# Patient Record
Sex: Female | Born: 1995 | Race: White | Hispanic: No | Marital: Single | Smoking: Never smoker
Health system: Southern US, Community
[De-identification: ages and names within clinical notes are randomized; demographics above are authoritative.]

## PROBLEM LIST (undated history)

## (undated) DIAGNOSIS — R109 Unspecified abdominal pain: Secondary | ICD-10-CM

## (undated) DIAGNOSIS — D649 Anemia, unspecified: Secondary | ICD-10-CM

## (undated) DIAGNOSIS — J45909 Unspecified asthma, uncomplicated: Secondary | ICD-10-CM

## (undated) DIAGNOSIS — F32A Depression, unspecified: Secondary | ICD-10-CM

## (undated) DIAGNOSIS — F329 Major depressive disorder, single episode, unspecified: Secondary | ICD-10-CM

## (undated) DIAGNOSIS — J189 Pneumonia, unspecified organism: Secondary | ICD-10-CM

## (undated) HISTORY — DX: Unspecified abdominal pain: R10.9

## (undated) HISTORY — PX: POLYDACTYLY RECONSTRUCTION: SHX439

## (undated) HISTORY — DX: Pneumonia, unspecified organism: J18.9

## (undated) HISTORY — DX: Depression, unspecified: F32.A

---
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
## 2018-06-10 ENCOUNTER — Emergency Department
Admission: EM | Admit: 2018-06-10 | Discharge: 2018-06-10 | Disposition: A | Discharge status: Home / Self Care | Payer: BLUE CROSS/BLUE SHIELD | Attending: Emergency Medicine | Admitting: Emergency Medicine

## 2018-06-10 ENCOUNTER — Other Ambulatory Visit: Payer: Self-pay

## 2018-06-10 ENCOUNTER — Emergency Department: Payer: BLUE CROSS/BLUE SHIELD

## 2018-06-10 DIAGNOSIS — J45909 Unspecified asthma, uncomplicated: Secondary | ICD-10-CM | POA: Diagnosis not present

## 2018-06-10 DIAGNOSIS — R1031 Right lower quadrant pain: Secondary | ICD-10-CM | POA: Insufficient documentation

## 2018-06-10 DIAGNOSIS — R102 Pelvic and perineal pain: Secondary | ICD-10-CM

## 2018-06-10 DIAGNOSIS — R109 Unspecified abdominal pain: Secondary | ICD-10-CM

## 2018-06-10 LAB — CBC
HEMATOCRIT: 37.9 % (ref 36.0–46.0)
HEMOGLOBIN: 11.9 g/dL — AB (ref 12.0–15.0)
MCH: 26.6 pg (ref 26.0–34.0)
MCHC: 31.4 g/dL (ref 30.0–36.0)
MCV: 84.6 fL (ref 80.0–100.0)
NRBC: 0 % (ref 0.0–0.2)
Platelets: 353 10*3/uL (ref 150–400)
RBC: 4.48 MIL/uL (ref 3.87–5.11)
RDW: 14.4 % (ref 11.5–15.5)
WBC: 9.8 10*3/uL (ref 4.0–10.5)

## 2018-06-10 LAB — COMPREHENSIVE METABOLIC PANEL
ALT: 27 U/L (ref 0–44)
AST: 23 U/L (ref 15–41)
Albumin: 4 g/dL (ref 3.5–5.0)
Alkaline Phosphatase: 68 U/L (ref 38–126)
Anion gap: 5 (ref 5–15)
BUN: 15 mg/dL (ref 6–20)
CHLORIDE: 105 mmol/L (ref 98–111)
CO2: 29 mmol/L (ref 22–32)
Calcium: 9.1 mg/dL (ref 8.9–10.3)
Creatinine, Ser: 0.59 mg/dL (ref 0.44–1.00)
Glucose, Bld: 107 mg/dL — ABNORMAL HIGH (ref 70–99)
POTASSIUM: 3.8 mmol/L (ref 3.5–5.1)
SODIUM: 139 mmol/L (ref 135–145)
Total Bilirubin: 0.5 mg/dL (ref 0.3–1.2)
Total Protein: 8 g/dL (ref 6.5–8.1)

## 2018-06-10 LAB — URINALYSIS, COMPLETE (UACMP) WITH MICROSCOPIC
BILIRUBIN URINE: NEGATIVE
Glucose, UA: NEGATIVE mg/dL
Ketones, ur: NEGATIVE mg/dL
NITRITE: NEGATIVE
PROTEIN: NEGATIVE mg/dL
SPECIFIC GRAVITY, URINE: 1.02 (ref 1.005–1.030)
pH: 6 (ref 5.0–8.0)

## 2018-06-10 LAB — POCT PREGNANCY, URINE: PREG TEST UR: NEGATIVE

## 2018-06-10 MED ORDER — DICYCLOMINE HCL 20 MG PO TABS: 20.0000 mg | ORAL_TABLET | Freq: Three times a day (TID) | ORAL | 0 refills | Status: DC | PRN

## 2018-06-10 MED ORDER — KETOROLAC TROMETHAMINE 30 MG/ML IJ SOLN
30.0000 mg | Freq: Once | INTRAMUSCULAR | Status: DC
  Filled 2018-06-10: qty 1

## 2018-06-10 MED ORDER — KETOROLAC TROMETHAMINE 30 MG/ML IJ SOLN
30.0000 mg | Freq: Once | INTRAMUSCULAR | Status: AC
  Administered 2018-06-10: 30 mg via INTRAMUSCULAR

## 2018-06-10 NOTE — ED Notes (Signed)
Pt verbalized understanding of discharge instructions. NAD at this time. 

## 2018-06-10 NOTE — ED Triage Notes (Signed)
Pt c/o right lower abd/flank pain x1 week - denies N/V/D - denies difficulty with urination - she went to urgent care several days ago and they told her that she had a UTI and treated her but she reports that she is no better

## 2018-06-10 NOTE — ED Provider Notes (Signed)
[facility] Emergency Department Provider Note  ____________________________________________   I have reviewed the triage vital signs and the nursing notes.   HISTORY  Chief Complaint Abdominal pain  History limited by: Not Limited   HPI Megan Barnes is a 23 y.o. female who presents to the emergency department today because of concerns for right sided abdominal pain.  Is located in the right lower abdomen.  Started roughly 1 week ago.  She states it has been fairly constant since then.  It is worse with movement.  She has been unable to lie on her side.  Somewhat relieved by lying flat on her back.  She has not noticed any change to her urine or stooling.  Patient has not had any nausea or vomiting.  No fevers.  No abnormal vaginal discharge.  Patient is currently menstruating.  Denies similar symptoms in the past.  Does have a history of cholecystectomy.  Denies any trauma to her side.  Did go to urgent care when the pain started and was treated for urinary tract infection.    Per medical record review patient has a history of cholecystectomy  Past Medical History:  Diagnosis Date  . Anemia   . Asthma     Patient Active Problem List   Diagnosis Date Noted  . Cholelithiasis with acute on chronic cholecystitis 03/05/2015  . Acute cholecystitis   . Abdominal pain, left upper quadrant 03/11/2013  . Abdominal pain, epigastric 03/11/2013  . Anemia, iron deficiency 03/11/2013    Past Surgical History:  Procedure Laterality Date  . CHOLECYSTECTOMY N/A 03/05/2015   Procedure: LAPAROSCOPIC CHOLECYSTECTOMY ;  Surgeon: Lattie Haw, MD;  Location: [facility] ORS;  Service: General;  Laterality: N/A;    Prior to Admission medications   Medication Sig Start Date End Date Taking? Authorizing Provider  fluticasone (FLONASE) 50 MCG/ACT nasal spray Place 2 sprays into both nostrils daily. 09/08/15 09/07/16  Tommi Rumps, PA-C  GILDESS FE 1/20 1-20 MG-MCG tablet  Take 1 tablet by mouth daily. 02/02/15   [provider]  PROAIR HFA 108 (90 BASE) MCG/ACT inhaler Inhale 2 puffs into the lungs every 4 (four) hours. 02/02/15   [provider]  QVAR 80 MCG/ACT inhaler Inhale 1 puff into the lungs 2 (two) times daily. 02/02/15   [provider]    Allergies Patient has no known allergies.  Family History  Problem Relation Age of Onset  . Cancer Maternal Aunt   . Diabetes Maternal Uncle   . Cancer Maternal Uncle   . Cancer Maternal Grandmother   . Diabetes Maternal Grandfather     Social History Social History   Tobacco Use  . Smoking status: Never Smoker  . Smokeless tobacco: Never Used  Substance Use Topics  . Alcohol use: No  . Drug use: No    Review of Systems Constitutional: No fever/chills Eyes: No visual changes. ENT: No sore throat. Cardiovascular: Denies chest pain. Respiratory: Denies shortness of breath. Gastrointestinal: Positive for right sided abdominal pain. Genitourinary: Negative for dysuria. Musculoskeletal: Negative for back pain. Skin: Negative for rash. Neurological: Negative for headaches, focal weakness or numbness.  ____________________________________________   PHYSICAL EXAM:  VITAL SIGNS: ED Triage Vitals [06/10/18 1022]  Enc Vitals Group     BP 135/78     Pulse Rate (!) 115     Resp 16     Temp 97.9 F (36.6 C)     Temp Source Oral     SpO2 97 %  Weight 210 lb (95.3 kg)     Height 5\' 8"  (1.727 m)     Head Circumference      Peak Flow      Pain Score 8   Constitutional: Alert and oriented.  Eyes: Conjunctivae are normal.  ENT      Head: Normocephalic and atraumatic.      Nose: No congestion/rhinnorhea.      Mouth/Throat: Mucous membranes are moist.      Neck: No stridor. Hematological/Lymphatic/Immunilogical: No cervical lymphadenopathy. Cardiovascular: Normal rate, regular rhythm.  No murmurs, rubs, or gallops.  Respiratory: Normal respiratory effort without  tachypnea nor retractions. Breath sounds are clear and equal bilaterally. No wheezes/rales/rhonchi. Gastrointestinal: Soft and tender to palpation in the right lower quadrant. Genitourinary: Deferred Musculoskeletal: Normal range of motion in all extremities. No lower extremity edema. Neurologic:  Normal speech and language. No gross focal neurologic deficits are appreciated.  Skin:  Skin is warm, dry and intact. No rash noted. Psychiatric: Mood and affect are normal. Speech and behavior are normal. Patient exhibits appropriate insight and judgment.  ____________________________________________    LABS (pertinent positives/negatives)  Upreg negative CMP wnl except glu 107 CBC wbc 9.8, hgb 11.9, plt 353 UA clear, moderate hgb dipstick, trace leukocytes, 6-10 wbc, rare bacteria  ____________________________________________   EKG  None  ____________________________________________    RADIOLOGY  US pelvis Within normal limits.   ____________________________________________   PROCEDURES  Procedures  ____________________________________________   INITIAL IMPRESSION / ASSESSMENT AND PLAN / ED COURSE  Pertinent labs & imaging results that were available during my care of the patient were reviewed by me and considered in my medical decision making (see chart for details).   Patient presented to the emergency department today because of concern for right sided abdominal pain. Differential would be broad including ovarian pathology, UTI, kidney stone, abdominal muscle, appendicitis, amongst other etiologies. Patient without any elevated WBC in her serum, no fever. At this point doubt appendicitis. [place] was performed which did not show any concerning findings. Patient did feel better after the medication. Did discuss ct scan with patient at this time will plan on deferring. Did discuss return precautions.   ____________________________________________   FINAL CLINICAL  IMPRESSION(S) / ED DIAGNOSES  Final diagnoses:  Right adnexal tenderness  Right sided abdominal pain     Note: This dictation was prepared with Dragon dictation. Any transcriptional errors that result from this process are unintentional     Phineas Semen, MD 06/10/18 [phone]

## 2018-06-10 NOTE — Discharge Instructions (Addendum)
Please seek medical attention for any high fevers, chest pain, shortness of breath, change in behavior, persistent vomiting, bloody stool or any other new or concerning symptoms.  

## 2018-06-12 LAB — URINE CULTURE: Culture: 30000 — AB

## 2018-11-06 ENCOUNTER — Ambulatory Visit: Payer: BLUE CROSS/BLUE SHIELD | Admitting: Family Medicine

## 2019-02-15 ENCOUNTER — Other Ambulatory Visit: Payer: Self-pay

## 2019-02-15 ENCOUNTER — Emergency Department
Admission: EM | Admit: 2019-02-15 | Discharge: 2019-02-15 | Disposition: A | Discharge status: Home / Self Care | Payer: BLUE CROSS/BLUE SHIELD | Attending: Emergency Medicine | Admitting: Emergency Medicine

## 2019-02-15 ENCOUNTER — Emergency Department: Payer: BLUE CROSS/BLUE SHIELD

## 2019-02-15 DIAGNOSIS — Z79899 Other long term (current) drug therapy: Secondary | ICD-10-CM | POA: Diagnosis not present

## 2019-02-15 DIAGNOSIS — S199XXA Unspecified injury of neck, initial encounter: Secondary | ICD-10-CM | POA: Diagnosis present

## 2019-02-15 DIAGNOSIS — Y9389 Activity, other specified: Secondary | ICD-10-CM | POA: Diagnosis not present

## 2019-02-15 DIAGNOSIS — S161XXA Strain of muscle, fascia and tendon at neck level, initial encounter: Secondary | ICD-10-CM

## 2019-02-15 DIAGNOSIS — Y92410 Unspecified street and highway as the place of occurrence of the external cause: Secondary | ICD-10-CM | POA: Insufficient documentation

## 2019-02-15 DIAGNOSIS — M25512 Pain in left shoulder: Secondary | ICD-10-CM | POA: Insufficient documentation

## 2019-02-15 DIAGNOSIS — Y999 Unspecified external cause status: Secondary | ICD-10-CM | POA: Insufficient documentation

## 2019-02-15 MED ORDER — TRAMADOL HCL 50 MG PO TABS: 50.0000 mg | ORAL_TABLET | Freq: Four times a day (QID) | ORAL | 0 refills | Status: AC | PRN

## 2019-02-15 MED ORDER — TRAMADOL HCL 50 MG PO TABS
50.0000 mg | ORAL_TABLET | Freq: Once | ORAL | Status: AC
  Administered 2019-02-15: 13:00:00 50 mg via ORAL
  Filled 2019-02-15: qty 1

## 2019-02-15 MED ORDER — IBUPROFEN 600 MG PO TABS: 600.0000 mg | ORAL_TABLET | Freq: Three times a day (TID) | ORAL | 0 refills | Status: DC | PRN

## 2019-02-15 MED ORDER — CYCLOBENZAPRINE HCL 10 MG PO TABS: 10.0000 mg | ORAL_TABLET | Freq: Three times a day (TID) | ORAL | 0 refills | Status: DC | PRN

## 2019-02-15 MED ORDER — IBUPROFEN 600 MG PO TABS
600.0000 mg | ORAL_TABLET | Freq: Once | ORAL | Status: AC
  Administered 2019-02-15: 13:00:00 600 mg via ORAL
  Filled 2019-02-15: qty 1

## 2019-02-15 MED ORDER — CYCLOBENZAPRINE HCL 10 MG PO TABS
10.0000 mg | ORAL_TABLET | Freq: Once | ORAL | Status: AC
  Administered 2019-02-15: 13:00:00 10 mg via ORAL
  Filled 2019-02-15: qty 1
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
## 2019-02-15 NOTE — Discharge Instructions (Signed)
Follow discharge care instructions and wear arm sling for 2 to 3 days as needed.  Take medication as directed but be advised it may cause drowsiness.
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
## 2019-02-21 ENCOUNTER — Telehealth: Payer: Self-pay | Admitting: Gerontology

## 2019-02-21 NOTE — Telephone Encounter (Signed)
Patient was not interested in attending [facility]

## 2019-03-30 ENCOUNTER — Encounter: Payer: Self-pay | Admitting: Emergency Medicine

## 2019-03-30 ENCOUNTER — Emergency Department
Admission: EM | Admit: 2019-03-30 | Discharge: 2019-03-30 | Disposition: A | Discharge status: Home / Self Care | Payer: BLUE CROSS/BLUE SHIELD | Attending: Emergency Medicine | Admitting: Emergency Medicine

## 2019-03-30 ENCOUNTER — Other Ambulatory Visit: Payer: Self-pay

## 2019-03-30 DIAGNOSIS — J45909 Unspecified asthma, uncomplicated: Secondary | ICD-10-CM | POA: Diagnosis not present

## 2019-03-30 DIAGNOSIS — J4521 Mild intermittent asthma with (acute) exacerbation: Secondary | ICD-10-CM | POA: Insufficient documentation

## 2019-03-30 DIAGNOSIS — R0602 Shortness of breath: Secondary | ICD-10-CM | POA: Diagnosis present

## 2019-03-30 MED ORDER — IPRATROPIUM-ALBUTEROL 0.5-2.5 (3) MG/3ML IN SOLN
3.0000 mL | Freq: Once | RESPIRATORY_TRACT | Status: AC
  Administered 2019-03-30: 22:00:00 3 mL via RESPIRATORY_TRACT
  Filled 2019-03-30: qty 3

## 2019-03-30 MED ORDER — ALBUTEROL SULFATE HFA 108 (90 BASE) MCG/ACT IN AERS: 2.0000 | INHALATION_SPRAY | Freq: Four times a day (QID) | RESPIRATORY_TRACT | 0 refills | Status: DC | PRN

## 2019-03-30 MED ORDER — ALBUTEROL SULFATE HFA 108 (90 BASE) MCG/ACT IN AERS
2.0000 | INHALATION_SPRAY | Freq: Once | RESPIRATORY_TRACT | Status: AC
  Administered 2019-03-30: 2 via RESPIRATORY_TRACT
  Filled 2019-03-30: qty 6.7

## 2019-03-30 MED ORDER — PREDNISONE 20 MG PO TABS
60.0000 mg | ORAL_TABLET | Freq: Once | ORAL | Status: AC
  Administered 2019-03-30: 22:00:00 60 mg via ORAL
  Filled 2019-03-30: qty 3

## 2019-03-30 NOTE — ED Notes (Signed)
Pt states she feels as though she is breathing much better since arrival after meds. Pt appears calmer and breathing easier.

## 2019-03-30 NOTE — ED Triage Notes (Signed)
Pt arrived via POV with reports of  3 days of shortness of breath, pt states she has hx of asthma and has run out of her inhaler and does not have a PCP to follow up with for refills.

## 2019-03-30 NOTE — ED Provider Notes (Signed)
[facility] Emergency Department Provider Note  ____________________________________________  Time seen: Approximately 10:13 PM  I have reviewed the triage vital signs and the nursing notes.   HISTORY  Chief Complaint Asthma    HPI Megan Barnes is a 23 y.o. female that presents to the emergency department for evaluation of shortness of breath for 3 days, consistent with her asthma.  Patient states that she does not have a primary care doctor so she has not been able to get a refill on her inhaler.  She thought she had enough inhaler to last her through the season but does not.  She ran out a couple of days ago.  Her asthma flares up whenever the seasons change.  She has had no recent illness.  No fever, nasal ingestion, cough, chest pain.   Past Medical History:  Diagnosis Date  . Abdominal pain   . Anemia   . Asthma   . Depression   . Pneumonia     Patient Active Problem List   Diagnosis Date Noted  . Cholelithiasis with acute on chronic cholecystitis 03/05/2015  . Acute cholecystitis   . Abdominal pain, left upper quadrant 03/11/2013  . Abdominal pain, epigastric 03/11/2013  . Anemia, iron deficiency 03/11/2013    Past Surgical History:  Procedure Laterality Date  . CHOLECYSTECTOMY N/A 03/05/2015   Procedure: LAPAROSCOPIC CHOLECYSTECTOMY ;  Surgeon: Florene Glen, MD;  Location: [facility] ORS;  Service: General;  Laterality: N/A;  . POLYDACTYLY RECONSTRUCTION      Prior to Admission medications   Medication Sig Start Date End Date Taking? Authorizing Provider  albuterol (VENTOLIN HFA) 108 (90 Base) MCG/ACT inhaler Inhale 2 puffs into the lungs every 6 (six) hours as needed for wheezing or shortness of breath. 03/30/19   Laban Emperor, PA-C  cyclobenzaprine (FLEXERIL) 10 MG tablet Take 1 tablet (10 mg total) by mouth 3 (three) times daily as needed. 02/15/19   Sable Feil, PA-C  dicyclomine (BENTYL) 20 MG tablet Take 1 tablet (20 mg total)  by mouth 3 (three) times daily as needed (abdominal pain). 06/10/18   Nance Pear, MD  fluticasone [facility]) 50 MCG/ACT nasal spray Place 2 sprays into both nostrils daily. 09/08/15 09/07/16  Johnn Hai, PA-C  GILDESS FE 1/20 1-20 MG-MCG tablet Take 1 tablet by mouth daily. 02/02/15   [provider]  ibuprofen (ADVIL) 600 MG tablet Take 1 tablet (600 mg total) by mouth every 8 (eight) hours as needed. 02/15/19   Sable Feil, PA-C  QVAR 80 MCG/ACT inhaler Inhale 1 puff into the lungs 2 (two) times daily. 02/02/15   [provider]    Allergies Penicillins  Family History  Problem Relation Age of Onset  . Cancer Maternal Aunt   . Diabetes Maternal Uncle   . Cancer Maternal Uncle   . Cancer Maternal Grandmother   . Diabetes Maternal Grandfather     Social History Social History   Tobacco Use  . Smoking status: Never Smoker  . Smokeless tobacco: Never Used  Substance Use Topics  . Alcohol use: No  . Drug use: No     Review of Systems  Constitutional: No fever/chills ENT: No upper respiratory complaints. Cardiovascular: No chest pain. Respiratory: No cough.  Positive for shortness of breath. Gastrointestinal: No abdominal pain.  No nausea, no vomiting.  Musculoskeletal: Negative for musculoskeletal pain. Skin: Negative for rash, abrasions, lacerations, ecchymosis. Neurological: Negative for headaches, numbness or tingling   ____________________________________________   PHYSICAL EXAM:  VITAL SIGNS: ED Triage Vitals [03/30/19 2014]  Enc Vitals Group     BP (!) 163/85     Pulse Rate 91     Resp 18     Temp 97.9 F (36.6 C)     Temp Source Oral     SpO2 98 %     Weight 193 lb (87.5 kg)     Height 5\' 8"  (1.727 m)     Head Circumference      Peak Flow      Pain Score 8     Pain Loc      Pain Edu?      Excl. in [place]?      Constitutional: Alert and oriented. Well appearing and in no acute distress. Eyes: Conjunctivae are normal. PERRL.  EOMI. Head: Atraumatic. ENT:      Ears:      Nose: No congestion/rhinnorhea.      Mouth/Throat: Mucous membranes are moist.  Neck: No stridor.  Cardiovascular: Normal rate, regular rhythm.  Good peripheral circulation. Respiratory: Normal respiratory effort without tachypnea or retractions.  Scattered inspiratory next very wheezes. Good air entry to the bases with no decreased or absent breath sounds. Musculoskeletal: Full range of motion to all extremities. No gross deformities appreciated. Neurologic:  Normal speech and language. No gross focal neurologic deficits are appreciated.  Skin:  Skin is warm, dry and intact. No rash noted. Psychiatric: Mood and affect are normal. Speech and behavior are normal. Patient exhibits appropriate insight and judgement.   ____________________________________________   LABS (all labs ordered are listed, but only abnormal results are displayed)  Labs Reviewed - No data to display ____________________________________________  EKG   ____________________________________________  RADIOLOGY  No results found.  ____________________________________________    PROCEDURES  Procedure(s) performed:    Procedures    Medications  ipratropium-albuterol (DUONEB) 0.5-2.5 (3) MG/3ML nebulizer solution 3 mL (3 mLs Nebulization Given 03/30/19 2141)  ipratropium-albuterol (DUONEB) 0.5-2.5 (3) MG/3ML nebulizer solution 3 mL (3 mLs Nebulization Given 03/30/19 2218)  predniSONE (DELTASONE) tablet 60 mg (60 mg Oral Given 03/30/19 2216)  albuterol (VENTOLIN HFA) 108 (90 Base) MCG/ACT inhaler 2 puff (2 puffs Inhalation Given 03/30/19 2300)     ____________________________________________   INITIAL IMPRESSION / ASSESSMENT AND PLAN / ED COURSE  Pertinent labs & imaging results that were available during my care of the patient were reviewed by me and considered in my medical decision making (see chart for details).  Review of the [place] CSRS was  performed in accordance of the [facility] prior to dispensing any controlled drugs.    Patient's diagnosis is consistent with asthma exacerbation.  Vital signs and exam are reassuring.  Symptoms improved after prednisone and DuoNeb treatments.  She feels much better.  She was also given an albuterol inhaler since the pharmacies are closed.  Patient will be discharged home with prescriptions for albuterol inhaler. Patient is to follow up with primary care as directed.  Referral was given.  Patient is given ED precautions to return to the ED for any worsening or new symptoms.  Dixon BoosChantal R Mccleave was evaluated in Emergency Department on 03/30/2019 for the symptoms described in the history of present illness. She was evaluated in the context of the global COVID-19 pandemic, which necessitated consideration that the patient might be at risk for infection with the SARS-CoV-2 virus that causes COVID-19. Institutional protocols and algorithms that pertain to the evaluation of patients at risk for COVID-19 are in a state of rapid change based on  information released by regulatory bodies including the CDC and federal and state organizations. These policies and algorithms were followed during the patient's care in the ED.   ____________________________________________  FINAL CLINICAL IMPRESSION(S) / ED DIAGNOSES  Final diagnoses:  Mild intermittent asthma with exacerbation      NEW MEDICATIONS STARTED DURING THIS VISIT:  ED Discharge Orders         Ordered    albuterol (VENTOLIN HFA) 108 (90 Base) MCG/ACT inhaler  Every 6 hours PRN     03/30/19 2248              This chart was dictated using voice recognition software/Dragon. Despite best efforts to proofread, errors can occur which can change the meaning. Any change was purely unintentional.    Enid Derry, PA-C 03/30/19 2307    Minna Antis, MD 03/31/19 [phone]

## 2019-03-30 NOTE — ED Notes (Signed)
Pt states she has asthma but no inhalers and no primary doctor. Pt appears to be stable, not breathing rapidly but has bilateral inspiratory and expiratory wheezes throughout lungs.

## 2019-04-10 ENCOUNTER — Other Ambulatory Visit: Payer: Self-pay

## 2019-04-10 DIAGNOSIS — Z20822 Contact with and (suspected) exposure to covid-19: Secondary | ICD-10-CM

## 2019-04-12 LAB — NOVEL CORONAVIRUS, NAA: SARS-CoV-2, NAA: NOT DETECTED

## 2019-09-13 ENCOUNTER — Emergency Department
Admission: EM | Admit: 2019-09-13 | Discharge: 2019-09-13 | Disposition: A | Discharge status: Home / Self Care | Payer: BLUE CROSS/BLUE SHIELD | Attending: Emergency Medicine | Admitting: Emergency Medicine

## 2019-09-13 ENCOUNTER — Encounter: Payer: Self-pay | Admitting: Emergency Medicine

## 2019-09-13 ENCOUNTER — Other Ambulatory Visit: Payer: Self-pay

## 2019-09-13 ENCOUNTER — Emergency Department: Payer: BLUE CROSS/BLUE SHIELD

## 2019-09-13 DIAGNOSIS — R519 Headache, unspecified: Secondary | ICD-10-CM | POA: Insufficient documentation

## 2019-09-13 DIAGNOSIS — Z79899 Other long term (current) drug therapy: Secondary | ICD-10-CM | POA: Insufficient documentation

## 2019-09-13 DIAGNOSIS — J45909 Unspecified asthma, uncomplicated: Secondary | ICD-10-CM | POA: Insufficient documentation

## 2019-09-13 MED ORDER — SULFAMETHOXAZOLE-TRIMETHOPRIM 800-160 MG PO TABS: 1.0000 | ORAL_TABLET | Freq: Two times a day (BID) | ORAL | 0 refills | Status: DC

## 2019-09-13 MED ORDER — KETOROLAC TROMETHAMINE 30 MG/ML IJ SOLN
30.0000 mg | Freq: Once | INTRAMUSCULAR | Status: AC
  Administered 2019-09-13: 30 mg via INTRAVENOUS
  Filled 2019-09-13: qty 1

## 2019-09-13 MED ORDER — DIPHENHYDRAMINE HCL 50 MG/ML IJ SOLN
25.0000 mg | Freq: Once | INTRAMUSCULAR | Status: AC
  Administered 2019-09-13: 25 mg via INTRAVENOUS
  Filled 2019-09-13: qty 1

## 2019-09-13 MED ORDER — ONDANSETRON HCL 4 MG/2ML IJ SOLN
4.0000 mg | Freq: Once | INTRAMUSCULAR | Status: AC
  Administered 2019-09-13: 4 mg via INTRAVENOUS
  Filled 2019-09-13: qty 2

## 2019-09-13 MED ORDER — FEXOFENADINE-PSEUDOEPHED ER 60-120 MG PO TB12: 1.0000 | ORAL_TABLET | Freq: Two times a day (BID) | ORAL | 0 refills | Status: DC

## 2019-09-13 MED ORDER — METOCLOPRAMIDE HCL 5 MG/ML IJ SOLN
20.0000 mg | Freq: Once | INTRAVENOUS | Status: DC
  Filled 2019-09-13: qty 4

## 2019-09-13 MED ORDER — NAPROXEN 500 MG PO TABS: 500.0000 mg | ORAL_TABLET | Freq: Two times a day (BID) | ORAL | Status: DC

## 2019-09-13 MED ORDER — SODIUM CHLORIDE 0.9 % IV BOLUS
500.0000 mL | Freq: Once | INTRAVENOUS | Status: AC
  Administered 2019-09-13: 500 mL via INTRAVENOUS

## 2019-09-13 MED ORDER — ACETAMINOPHEN 500 MG PO TABS: 1000.0000 mg | ORAL_TABLET | Freq: Once | ORAL | Status: DC

## 2019-09-13 NOTE — ED Notes (Signed)
See triage note  Presents with over 1 month of h/a  States h/a's are getting worse  Positive couple of days ago  No relief OTC meds

## 2019-09-13 NOTE — ED Triage Notes (Signed)
Pt has been having worsening headaches. Went to urgent care but they told her they cannot do anything for her and that if it got worse to come to ED.  Has tried tylenol and motrin without relief. They have been coming over last month.  Headaches have been to left temporal/parietal area.  + photophobia. Vomit X 1 a few days ago.

## 2019-09-13 NOTE — Discharge Instructions (Addendum)
Follow discharge care instructions and take medication as directed.  Advised to establish care with PCP.

## 2019-09-13 NOTE — ED Provider Notes (Signed)
[facility] Emergency Department Provider Note   ____________________________________________   First MD Initiated Contact with Patient 09/13/19 1419     (approximate)  I have reviewed the triage vital signs and the nursing notes.   HISTORY  Chief Complaint Headache    HPI Megan Barnes is a 24 y.o. female patient presents with worsening of headache patient went to urgent care today with a toileting identity for headache.  Patient was advised to come to ED if headache worsen patient says she has tried Tylenol and Motrin today for relief.  Patient headaches has been increasing over the past month.  Headache is to the left temporal parietal area associated with photophobia, nausea, and vomiting.  Patient state intimating vertigo.  Patient denies weakness.  No previous history of migraines.  No family history of migraines.  Patient rates the pain as a 10/10.  Patient described the pain is "achy".         Past Medical History:  Diagnosis Date  . Abdominal pain   . Anemia   . Asthma   . Depression   . Pneumonia     Patient Active Problem List   Diagnosis Date Noted  . Cholelithiasis with acute on chronic cholecystitis 03/05/2015  . Acute cholecystitis   . Abdominal pain, left upper quadrant 03/11/2013  . Abdominal pain, epigastric 03/11/2013  . Anemia, iron deficiency 03/11/2013    Past Surgical History:  Procedure Laterality Date  . CHOLECYSTECTOMY N/A 03/05/2015   Procedure: LAPAROSCOPIC CHOLECYSTECTOMY ;  Surgeon: Lattie Haw, MD;  Location: [facility] ORS;  Service: General;  Laterality: N/A;  . POLYDACTYLY RECONSTRUCTION      Prior to Admission medications   Medication Sig Start Date End Date Taking? Authorizing Provider  albuterol (VENTOLIN HFA) 108 (90 Base) MCG/ACT inhaler Inhale 2 puffs into the lungs every 6 (six) hours as needed for wheezing or shortness of breath. 03/30/19   Enid Derry, PA-C  cyclobenzaprine (FLEXERIL) 10 MG  tablet Take 1 tablet (10 mg total) by mouth 3 (three) times daily as needed. 02/15/19   Joni Reining, PA-C  dicyclomine (BENTYL) 20 MG tablet Take 1 tablet (20 mg total) by mouth 3 (three) times daily as needed (abdominal pain). 06/10/18   Phineas Semen, MD  fexofenadine-pseudoephedrine (ALLEGRA-D) 60-120 MG 12 hr tablet Take 1 tablet by mouth 2 (two) times daily. 09/13/19   Joni Reining, PA-C  fluticasone (FLONASE) 50 MCG/ACT nasal spray Place 2 sprays into both nostrils daily. 09/08/15 09/07/16  Tommi Rumps, PA-C  GILDESS FE 1/20 1-20 MG-MCG tablet Take 1 tablet by mouth daily. 02/02/15   [provider]  ibuprofen (ADVIL) 600 MG tablet Take 1 tablet (600 mg total) by mouth every 8 (eight) hours as needed. 02/15/19   Joni Reining, PA-C  naproxen (NAPROSYN) 500 MG tablet Take 1 tablet (500 mg total) by mouth 2 (two) times daily with a meal. 09/13/19   Joni Reining, PA-C  QVAR 80 MCG/ACT inhaler Inhale 1 puff into the lungs 2 (two) times daily. 02/02/15   [provider]  sulfamethoxazole-trimethoprim (BACTRIM DS) 800-160 MG tablet Take 1 tablet by mouth 2 (two) times daily. 09/13/19   Joni Reining, PA-C    Allergies Penicillins  Family History  Problem Relation Age of Onset  . Cancer Maternal Aunt   . Diabetes Maternal Uncle   . Cancer Maternal Uncle   . Cancer Maternal Grandmother   . Diabetes Maternal Grandfather  Social History Social History   Tobacco Use  . Smoking status: Never Smoker  . Smokeless tobacco: Never Used  Substance Use Topics  . Alcohol use: No  . Drug use: No    Review of Systems  Constitutional: No fever/chills Eyes: Photophobia. ENT: No sore throat. Cardiovascular: Denies chest pain. Respiratory: Denies shortness of breath. Gastrointestinal: No abdominal pain.  Nausea and vomiting.  No diarrhea.  No constipation. Genitourinary: Negative for dysuria. Musculoskeletal: Negative for back pain. Skin: Negative for rash.  Neurological: Positive for headaches, but denies focal weakness or numbness. Psychiatric:  Depression Hematological/Lymphatic:  Anemia Allergic/Immunilogical: Penicillin ____________________________________________   PHYSICAL EXAM:  VITAL SIGNS: ED Triage Vitals  Enc Vitals Group     BP 09/13/19 1350 (!) 144/77     Pulse Rate 09/13/19 1350 81     Resp 09/13/19 1350 18     Temp 09/13/19 1350 97.7 F (36.5 C)     Temp Source 09/13/19 1350 Oral     SpO2 09/13/19 1350 100 %     Weight 09/13/19 1349 200 lb (90.7 kg)     Height 09/13/19 1349 5\' 8"  (1.727 m)     Head Circumference --      Peak Flow --      Pain Score 09/13/19 1349 10     Pain Loc --      Pain Edu? --      Excl. in [place]? --     Constitutional: Alert and oriented. Well appearing and in no acute distress. Eyes: Conjunctivae are normal. PERRL. EOMI. photophobia Head: Atraumatic. Nose: Positive guarding with palpation of ethmoid sinus area. Mouth/Throat: Mucous membranes are moist.  Oropharynx non-erythematous.  Postnasal drainage. Neck: No stridor.  No cervical spine tenderness to palpation. Hematological/Lymphatic/Immunilogical: No cervical lymphadenopathy. Cardiovascular: Normal rate, regular rhythm. Grossly normal heart sounds.  Good peripheral circulation. Respiratory: Normal respiratory effort.  No retractions. Lungs CTAB. Gastrointestinal: Soft and nontender. No distention. No abdominal bruits. No CVA tenderness. Genitourinary: Deferred Neurologic:  Normal speech and language. No gross focal neurologic deficits are appreciated. No gait instability. Skin:  Skin is warm, dry and intact. No rash noted. Psychiatric: Mood and affect are normal. Speech and behavior are normal.  ____________________________________________   LABS (all labs ordered are listed, but only abnormal results are displayed)  Labs Reviewed - No data to display ____________________________________________  EKG    ____________________________________________  RADIOLOGY  ED MD interpretation:    Official radiology report(s): CT Head Wo Contrast  Result Date: 09/13/2019 CLINICAL DATA:  Progressive headache EXAM: CT HEAD WITHOUT CONTRAST TECHNIQUE: Contiguous axial images were obtained from the base of the skull through the vertex without intravenous contrast. COMPARISON:  None. FINDINGS: Brain: Ventricles and sulci are normal in size and configuration. Cerebellar tonsils are at the lower limits of normal. There is no intracranial mass, hemorrhage, extra-axial fluid collection, or midline shift. Brain parenchyma appears unremarkable. There is no appreciable acute infarct. Vascular: No hyperdense vessel. No evident arterial vascular calcification. Skull: The bony calvarium appears intact. Sinuses/Orbits: There is opacification in posterior ethmoid air cells, more severe on the right than on the left. There is patchy airspace opacity in the right sphenoid sinus. Other visualized paranasal sinuses are clear. Visualized orbits appear symmetric bilaterally. Other: Visualized mastoid air cells are clear. IMPRESSION: Ventricles and sulci appear normal. Cerebellar tonsils appear at the lower limits of normal. Brain parenchyma appears unremarkable. No evident mass or hemorrhage. Foci of paranasal sinus disease noted. Electronically Signed   By: Gwyndolyn Saxon  Margarita Grizzle III M.D.   On: 09/13/2019 15:03    ____________________________________________   PROCEDURES  Procedure(s) performed (including Critical Care):  Procedures   ____________________________________________   INITIAL IMPRESSION / ASSESSMENT AND PLAN / ED COURSE  As part of my medical decision making, I reviewed the following data within the electronic MEDICAL RECORD NUMBER     Patient presents with 1 month of increasing headache.  Differentials consist of intracranial bleed, migraine headache, or sinus headache.  Discussed CT scans results with patient.       Megan Barnes was evaluated in Emergency Department on 09/13/2019 for the symptoms described in the history of present illness. She was evaluated in the context of the global COVID-19 pandemic, which necessitated consideration that the patient might be at risk for infection with the SARS-CoV-2 virus that causes COVID-19. Institutional protocols and algorithms that pertain to the evaluation of patients at risk for COVID-19 are in a state of rapid change based on information released by regulatory bodies including the CDC and federal and state organizations. These policies and algorithms were followed during the patient's care in the ED.       ____________________________________________   FINAL CLINICAL IMPRESSION(S) / ED DIAGNOSES  Final diagnoses:  Sinus headache     ED Discharge Orders         Ordered    sulfamethoxazole-trimethoprim (BACTRIM DS) 800-160 MG tablet  2 times daily     09/13/19 1512    fexofenadine-pseudoephedrine (ALLEGRA-D) 60-120 MG 12 hr tablet  2 times daily     09/13/19 1512    naproxen (NAPROSYN) 500 MG tablet  2 times daily with meals     09/13/19 1512           Note:  This document was prepared using Dragon voice recognition software and may include unintentional dictation errors.    Joni Reining, PA-C 09/13/19 1518    Emily Filbert, MD 09/13/19 [phone]

## 2020-07-03 ENCOUNTER — Encounter: Payer: Self-pay | Admitting: Emergency Medicine

## 2020-07-03 ENCOUNTER — Emergency Department
Admission: EM | Admit: 2020-07-03 | Discharge: 2020-07-03 | Disposition: A | Discharge status: Home / Self Care | Payer: BC Managed Care – PPO | Attending: Emergency Medicine | Admitting: Emergency Medicine

## 2020-07-03 ENCOUNTER — Other Ambulatory Visit: Payer: Self-pay

## 2020-07-03 ENCOUNTER — Emergency Department: Payer: BC Managed Care – PPO

## 2020-07-03 DIAGNOSIS — J4541 Moderate persistent asthma with (acute) exacerbation: Secondary | ICD-10-CM

## 2020-07-03 DIAGNOSIS — Z79899 Other long term (current) drug therapy: Secondary | ICD-10-CM | POA: Diagnosis not present

## 2020-07-03 DIAGNOSIS — R0602 Shortness of breath: Secondary | ICD-10-CM | POA: Diagnosis present

## 2020-07-03 LAB — BASIC METABOLIC PANEL
Anion gap: 8 (ref 5–15)
BUN: 9 mg/dL (ref 6–20)
CO2: 22 mmol/L (ref 22–32)
Calcium: 9.1 mg/dL (ref 8.9–10.3)
Chloride: 105 mmol/L (ref 98–111)
Creatinine, Ser: 0.79 mg/dL (ref 0.44–1.00)
GFR, Estimated: >60 mL/min (ref >60)
Glucose, Bld: 99 mg/dL (ref 70–99)
Potassium: 3.8 mmol/L (ref 3.5–5.1)
Sodium: 135 mmol/L (ref 135–145)

## 2020-07-03 LAB — CBC WITH DIFFERENTIAL/PLATELET
Abs Immature Granulocytes: 0.02 10*3/uL (ref 0.00–0.07)
Basophils Absolute: 0.1 10*3/uL (ref 0.0–0.1)
Basophils Relative: 1 %
Eosinophils Absolute: 1.1 10*3/uL — ABNORMAL HIGH (ref 0.0–0.5)
Eosinophils Relative: 12 %
HCT: 40 % (ref 36.0–46.0)
Hemoglobin: 12.8 g/dL (ref 12.0–15.0)
Immature Granulocytes: 0 %
Lymphocytes Relative: 29 %
Lymphs Abs: 2.5 10*3/uL (ref 0.7–4.0)
MCH: 26.1 pg (ref 26.0–34.0)
MCHC: 32 g/dL (ref 30.0–36.0)
MCV: 81.5 fL (ref 80.0–100.0)
Monocytes Absolute: 0.5 10*3/uL (ref 0.1–1.0)
Monocytes Relative: 6 %
Neutro Abs: 4.5 10*3/uL (ref 1.7–7.7)
Neutrophils Relative %: 52 %
Platelets: 392 10*3/uL (ref 150–400)
RBC: 4.91 MIL/uL (ref 3.87–5.11)
RDW: 15.2 % (ref 11.5–15.5)
WBC: 8.6 10*3/uL (ref 4.0–10.5)
nRBC: 0 % (ref 0.0–0.2)

## 2020-07-03 LAB — TROPONIN I (HIGH SENSITIVITY): Troponin I (High Sensitivity): <2 ng/L (ref <18)

## 2020-07-03 MED ORDER — BUDESONIDE 180 MCG/ACT IN AEPB
1.0000 | INHALATION_SPRAY | Freq: Two times a day (BID) | RESPIRATORY_TRACT | Status: DC
  Administered 2020-07-03: 1 via RESPIRATORY_TRACT
  Filled 2020-07-03: qty 1

## 2020-07-03 MED ORDER — IPRATROPIUM-ALBUTEROL 0.5-2.5 (3) MG/3ML IN SOLN
6.0000 mL | Freq: Once | RESPIRATORY_TRACT | Status: AC
  Administered 2020-07-03: 6 mL via RESPIRATORY_TRACT
  Filled 2020-07-03: qty 3

## 2020-07-03 MED ORDER — METHYLPREDNISOLONE SODIUM SUCC 125 MG IJ SOLR
125.0000 mg | Freq: Once | INTRAMUSCULAR | Status: AC
  Administered 2020-07-03: 125 mg via INTRAVENOUS
  Filled 2020-07-03: qty 2

## 2020-07-03 MED ORDER — PREDNISONE 50 MG PO TABS: 50.0000 mg | ORAL_TABLET | Freq: Every day | ORAL | 0 refills | Status: AC

## 2020-07-03 MED ORDER — MAGNESIUM SULFATE 2 GM/50ML IV SOLN
2.0000 g | Freq: Once | INTRAVENOUS | Status: AC
  Administered 2020-07-03: 2 g via INTRAVENOUS
  Filled 2020-07-03: qty 50

## 2020-07-03 MED ORDER — ALBUTEROL SULFATE HFA 108 (90 BASE) MCG/ACT IN AERS
1.0000 | INHALATION_SPRAY | Freq: Once | RESPIRATORY_TRACT | Status: AC
  Administered 2020-07-03: 1 via RESPIRATORY_TRACT
  Filled 2020-07-03: qty 6.7

## 2020-07-03 NOTE — ED Triage Notes (Signed)
C/O having an asthma attack x 1 hour.  States does not have inhaler with her.

## 2020-07-03 NOTE — ED Notes (Signed)
Patient reports asthma symptoms, SOB x 1 day. Patient reports lack of inhaler r/t not having a refill on her rx. Patient reports shortness of breath worsens with exertion. Patient also reports congestion r/t seasonal allergies. Patient reports taking Zyrtec PTA.

## 2020-07-03 NOTE — ED Triage Notes (Signed)
AAOx3.  Skin warm and dry.  Using accessory muscles.

## 2020-07-03 NOTE — ED Provider Notes (Signed)
[facility] Emergency Department Provider Note ____________________________________________   Event Date/Time   First MD Initiated Contact with Patient 07/03/20 1532     (approximate)  I have reviewed the triage vital signs and the nursing notes.  HISTORY  Chief Complaint Shortness of Breath   HPI Megan Barnes is a 25 y.o. femalewho presents to the ED for evaluation of shortness of breath.  Chart review indicates persistent asthma. Patient reports previously having Symbicort and albuterol as needed at home, but running out of these medications due to financial and insurance difficulties.  She reports last having her Symbicort a couple weeks ago and her last albuterol was a couple days ago.  She presents to the ED today for evaluation of 1-2 hours of acute shortness of breath and chest tightness, reporting "I am having an asthma attack."  She denies any syncopal episodes, recent fevers or illnesses, abdominal pain, emesis, diarrhea.  She does report a degree of chest tightness with shortness of breath.  Reports nonproductive coughing.  She is concerned about making it to work, supposed to be starting in 3 hours.   Past Medical History:  Diagnosis Date  . Abdominal pain   . Anemia   . Asthma   . Depression   . Pneumonia     Patient Active Problem List   Diagnosis Date Noted  . Cholelithiasis with acute on chronic cholecystitis 03/05/2015  . Acute cholecystitis   . Abdominal pain, left upper quadrant 03/11/2013  . Abdominal pain, epigastric 03/11/2013  . Anemia, iron deficiency 03/11/2013    Past Surgical History:  Procedure Laterality Date  . CHOLECYSTECTOMY N/A 03/05/2015   Procedure: LAPAROSCOPIC CHOLECYSTECTOMY ;  Surgeon: Lattie Haw, MD;  Location: [facility] ORS;  Service: General;  Laterality: N/A;  . POLYDACTYLY RECONSTRUCTION      Prior to Admission medications   Medication Sig Start Date End Date Taking? Authorizing Provider   albuterol (VENTOLIN HFA) 108 (90 Base) MCG/ACT inhaler Inhale 2 puffs into the lungs every 6 (six) hours as needed for wheezing or shortness of breath. 03/30/19   Enid Derry, PA-C  cyclobenzaprine (FLEXERIL) 10 MG tablet Take 1 tablet (10 mg total) by mouth 3 (three) times daily as needed. 02/15/19   Joni Reining, PA-C  dicyclomine (BENTYL) 20 MG tablet Take 1 tablet (20 mg total) by mouth 3 (three) times daily as needed (abdominal pain). 06/10/18   Phineas Semen, MD  fexofenadine-pseudoephedrine (ALLEGRA-D) 60-120 MG 12 hr tablet Take 1 tablet by mouth 2 (two) times daily. 09/13/19   Joni Reining, PA-C  fluticasone (FLONASE) 50 MCG/ACT nasal spray Place 2 sprays into both nostrils daily. 09/08/15 09/07/16  Tommi Rumps, PA-C  GILDESS FE 1/20 1-20 MG-MCG tablet Take 1 tablet by mouth daily. 02/02/15   [provider]  ibuprofen (ADVIL) 600 MG tablet Take 1 tablet (600 mg total) by mouth every 8 (eight) hours as needed. 02/15/19   Joni Reining, PA-C  naproxen (NAPROSYN) 500 MG tablet Take 1 tablet (500 mg total) by mouth 2 (two) times daily with a meal. 09/13/19   Joni Reining, PA-C  QVAR 80 MCG/ACT inhaler Inhale 1 puff into the lungs 2 (two) times daily. 02/02/15   [provider]  sulfamethoxazole-trimethoprim (BACTRIM DS) 800-160 MG tablet Take 1 tablet by mouth 2 (two) times daily. 09/13/19   Joni Reining, PA-C    Allergies Penicillins  Family History  Problem Relation Age of Onset  . Cancer Maternal Aunt   .  Diabetes Maternal Uncle   . Cancer Maternal Uncle   . Cancer Maternal Grandmother   . Diabetes Maternal Grandfather     Social History Social History   Tobacco Use  . Smoking status: Never Smoker  . Smokeless tobacco: Never Used  Substance Use Topics  . Alcohol use: No  . Drug use: No    Review of Systems  Constitutional: No fever/chills Eyes: No visual changes. ENT: No sore throat. Cardiovascular: Positive for chest  pain. Respiratory: Positive for shortness of breath and nonproductive cough. Gastrointestinal: No abdominal pain.  No nausea, no vomiting.  No diarrhea.  No constipation. Genitourinary: Negative for dysuria. Musculoskeletal: Negative for back pain. Skin: Negative for rash. Neurological: Negative for headaches, focal weakness or numbness.  ____________________________________________   PHYSICAL EXAM:  VITAL SIGNS: Vitals:   07/03/20 1616 07/03/20 1815  BP: (!) 143/60 138/75  Pulse: 96 90  Resp: 16 16  Temp:    SpO2: 95% 95%     Constitutional: Alert and oriented. Well appearing and in no acute distress.  Obese.  Sitting up in bed tripoding and tachypneic, speaking in phrases only. Eyes: Conjunctivae are normal. PERRL. EOMI. Head: Atraumatic. Nose: No congestion/rhinnorhea. Mouth/Throat: Mucous membranes are moist.  Oropharynx non-erythematous. Neck: No stridor. No cervical spine tenderness to palpation. Cardiovascular: Tachycardic rate, regular rhythm. Grossly normal heart sounds.  Good peripheral circulation. Respiratory: Tachypneic to the upper 20s.  No retractions.  Diffuse expiratory wheezes and decreased air movement throughout without focal features. Gastrointestinal: Soft , nondistended, nontender to palpation. No CVA tenderness. Musculoskeletal: No lower extremity tenderness nor edema.  No joint effusions. No signs of acute trauma. Neurologic:  Normal speech and language. No gross focal neurologic deficits are appreciated. No gait instability noted. Skin:  Skin is warm, dry and intact. No rash noted. Psychiatric: Mood and affect are normal. Speech and behavior are normal.  ____________________________________________   LABS (all labs ordered are listed, but only abnormal results are displayed)  Labs Reviewed  CBC WITH DIFFERENTIAL/PLATELET - Abnormal; Notable for the following components:      Result Value   Eosinophils Absolute 1.1 (*)    All other components  within normal limits  BASIC METABOLIC PANEL  POC URINE PREG, ED  TROPONIN I (HIGH SENSITIVITY)  TROPONIN I (HIGH SENSITIVITY)   ____________________________________________  12 Lead EKG  Sinus rhythm, rate of 99 bpm.  Normal axis and intervals.  Inferior T wave inversions without STEMI criteria.  No comparison. ____________________________________________  RADIOLOGY  ED MD interpretation: 1 view CXR reviewed by me without evidence of lobar filtration or CAP, no PTX.  Perihilar streaking noted and peribronchial cuffing.  Official radiology report(s): DG Chest Portable 1 View  Result Date: 07/03/2020 CLINICAL DATA:  Asthma exacerbation EXAM: PORTABLE CHEST 1 VIEW COMPARISON:  None. FINDINGS: The heart size and mediastinal contours are within normal limits. Both lungs are clear. The visualized skeletal structures are unremarkable. IMPRESSION: No active disease. Electronically Signed   By: Sharlet Salina M.D.   On: 07/03/2020 16:08    ____________________________________________   PROCEDURES and INTERVENTIONS  Procedure(s) performed (including Critical Care):  .1-3 Lead EKG Interpretation Performed by: Delton Prairie, MD Authorized by: Delton Prairie, MD     Interpretation: abnormal     ECG rate:  106   ECG rate assessment: tachycardic     Rhythm: sinus tachycardia     Ectopy: none     Conduction: normal      Medications  budesonide (PULMICORT) 180 MCG/ACT inhaler 1 puff (  1 puff Inhalation Given 07/03/20 1759)  methylPREDNISolone sodium succinate (SOLU-MEDROL) 125 mg/2 mL injection 125 mg (125 mg Intravenous Given 07/03/20 1611)  ipratropium-albuterol (DUONEB) 0.5-2.5 (3) MG/3ML nebulizer solution 6 mL (6 mLs Nebulization Given 07/03/20 1556)  magnesium sulfate IVPB 2 g 50 mL (0 g Intravenous Stopped 07/03/20 1758)  albuterol (VENTOLIN HFA) 108 (90 Base) MCG/ACT inhaler 1 puff (1 puff Inhalation Given 07/03/20 1759)    ____________________________________________   MDM / ED  COURSE   25 year old woman with moderate persistent asthma presents to the ED with evidence of an asthma exacerbation, likely due to running out of medications at home, and ultimately amenable to outpatient management.  Patient presents tachycardic, tachypneic and hypertensive, but not hypoxic or febrile.  Exam demonstrates an upright, tripoding patient who is wheezing with evidence of asthma exacerbation.  She has no signs of trauma, neurovascular deficits, intra-abdominal pathology.  CXR demonstrates no infiltrates and blood work is unremarkable.  EKG is nonischemic.  Exacerbation likely precipitated by lack of medications and seasonal exposure.  Due to her insurance and financial difficulties, we provide her with albuterol and Symbicort inhalers to go home with.  We discussed the possibility of observation medical admission due to her continued wheezing, although significantly improved after breathing treatments, but she declined and indicates that she has to go to work this evening.  She looks much better and I think this is reasonable.  We thoroughly discussed return precautions for the ED.  She reports that she does not think she can afford prednisone tablets at home, but I sent her a prescription to [facility].  Patient stable for outpatient management.  Clinical Course as of 07/03/20 1818  Fri Jul 03, 2020  1640 Reassessed.  Patient reports improved symptoms.  She looks more relaxed and is lying back in bed.  Reexamination reveals improved air movement, but still persistent and scattered expiratory wheezing.  She reports that she has to make it to work today and declines the possibility of medical admission.  We discussed trying to get her prescriptions for albuterol and Symbicort to take home with her before going to work.  She reports she has a PCP visit next week. [DS]    Clinical Course User Index [DS] Delton Prairie, MD    ____________________________________________   FINAL CLINICAL  IMPRESSION(S) / ED DIAGNOSES  Final diagnoses:  Moderate persistent asthma with exacerbation  Shortness of breath     ED Discharge Orders    None       [place] Merida Katrinka Blazing   Note:  This document was prepared using Dragon voice recognition software and may include unintentional dictation errors.   Delton Prairie, MD 07/03/20 [phone]

## 2020-07-03 NOTE — Discharge Instructions (Signed)
If you develop any worsening symptoms before you can see your regular doctor, despite the inhalers that we gave you, please return to the ED.

## 2020-07-03 NOTE — ED Notes (Signed)
Repeat BMP sent to lab. Patient updated on POC, and delay.

## 2020-11-20 ENCOUNTER — Encounter: Payer: Self-pay | Admitting: Emergency Medicine

## 2020-11-20 ENCOUNTER — Other Ambulatory Visit: Payer: Self-pay

## 2020-11-20 ENCOUNTER — Emergency Department
Admission: EM | Admit: 2020-11-20 | Discharge: 2020-11-20 | Disposition: A | Discharge status: Home / Self Care | Payer: BC Managed Care – PPO | Attending: Emergency Medicine | Admitting: Emergency Medicine

## 2020-11-20 DIAGNOSIS — J45909 Unspecified asthma, uncomplicated: Secondary | ICD-10-CM | POA: Diagnosis not present

## 2020-11-20 DIAGNOSIS — R0602 Shortness of breath: Secondary | ICD-10-CM | POA: Diagnosis present

## 2020-11-20 DIAGNOSIS — R0789 Other chest pain: Secondary | ICD-10-CM | POA: Diagnosis not present

## 2020-11-20 DIAGNOSIS — R059 Cough, unspecified: Secondary | ICD-10-CM | POA: Diagnosis not present

## 2020-11-20 DIAGNOSIS — Z5321 Procedure and treatment not carried out due to patient leaving prior to being seen by health care provider: Secondary | ICD-10-CM | POA: Diagnosis not present

## 2020-11-20 MED ORDER — IPRATROPIUM-ALBUTEROL 0.5-2.5 (3) MG/3ML IN SOLN
RESPIRATORY_TRACT | Status: AC
  Administered 2020-11-20: 3 mL via RESPIRATORY_TRACT
  Filled 2020-11-20: qty 3

## 2020-11-20 MED ORDER — PREDNISONE 20 MG PO TABS
60.0000 mg | ORAL_TABLET | Freq: Once | ORAL | Status: AC
  Administered 2020-11-20: 60 mg via ORAL

## 2020-11-20 MED ORDER — IPRATROPIUM-ALBUTEROL 0.5-2.5 (3) MG/3ML IN SOLN: 3.0000 mL | Freq: Once | RESPIRATORY_TRACT | Status: AC

## 2020-11-20 NOTE — ED Triage Notes (Signed)
Pt to ED from home c/o SOB for a couple days that has worsened tonight.  States hx of asthma, used inhaler at home last around 2300 without relief, states cough started 1 hour ago, denies fevers or recent illness.  Pt states tightness in chest when breathing in, pt with labored breathing and tachypnic, speaking in short sentences.

## 2020-11-20 NOTE — ED Notes (Signed)
Neb completed; resp even/unlab with clear BS, oxim 95% on ra; will cont to monitor pt

## 2021-01-29 ENCOUNTER — Other Ambulatory Visit: Payer: Self-pay

## 2021-01-29 ENCOUNTER — Encounter: Payer: Self-pay | Admitting: Advanced Practice Midwife

## 2021-01-29 ENCOUNTER — Ambulatory Visit: Payer: Self-pay | Admitting: Advanced Practice Midwife

## 2021-01-29 DIAGNOSIS — Z202 Contact with and (suspected) exposure to infections with a predominantly sexual mode of transmission: Secondary | ICD-10-CM

## 2021-01-29 DIAGNOSIS — F1290 Cannabis use, unspecified, uncomplicated: Secondary | ICD-10-CM | POA: Insufficient documentation

## 2021-01-29 DIAGNOSIS — F325 Major depressive disorder, single episode, in full remission: Secondary | ICD-10-CM

## 2021-01-29 DIAGNOSIS — J45909 Unspecified asthma, uncomplicated: Secondary | ICD-10-CM

## 2021-01-29 DIAGNOSIS — Z113 Encounter for screening for infections with a predominantly sexual mode of transmission: Secondary | ICD-10-CM

## 2021-01-29 LAB — WET PREP FOR TRICH, YEAST, CLUE
Trichomonas Exam: NEGATIVE
Yeast Exam: NEGATIVE

## 2021-01-29 LAB — HM HIV SCREENING LAB: HM HIV Screening: NEGATIVE

## 2021-01-29 LAB — HM HEPATITIS C SCREENING LAB: HM Hepatitis Screen: NEGATIVE

## 2021-01-29 LAB — PREGNANCY, URINE: Preg Test, Ur: NEGATIVE

## 2021-01-29 MED ORDER — DOXYCYCLINE HYCLATE 100 MG PO TABS: 100.0000 mg | ORAL_TABLET | Freq: Two times a day (BID) | ORAL | 0 refills | Status: DC

## 2021-01-29 NOTE — Progress Notes (Signed)
Pt here for STD screening and as a contact to Syphilis.  Wet mount results reviewed.  Medication dispensed per Provider orders.  Pt declined condoms. Berdie Ogren, RN

## 2021-01-29 NOTE — Progress Notes (Signed)
[facility] Department STI clinic/screening visit  Subjective:  Megan Barnes is a 25 y.o. SWF nullip exsmoker female being seen today for an STI screening visit. The patient reports they do have symptoms.  Patient reports that they do not desire a pregnancy in the next year.   They reported they are not interested in discussing contraception today.  Patient's last menstrual period was 01/28/2021 (exact date).   Patient has the following medical conditions:   Patient Active Problem List   Diagnosis Date Noted   Morbid obesity (HCC) 236 lbs 01/29/2021   Cholelithiasis with acute on chronic cholecystitis 03/05/2015   Acute cholecystitis    Abdominal pain, left upper quadrant 03/11/2013   Abdominal pain, epigastric 03/11/2013   Anemia, iron deficiency 03/11/2013    Chief Complaint  Patient presents with   SEXUALLY TRANSMITTED DISEASE    Screening.  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 Also c/o pain "where my legs meet my body" and irritation on perineum x few days after wiping self roughly with a towel due to itching there. 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  Last sex 01/20/21 with condom; with current partner x 3 wks; 2 sex partners in last 3 mo. LMP 01/03/21. Last MJ 01/22/21. Last cig age 12. Last ETOH 01/23/21 (1/2 bottle Tequila+4 shots vodka) q weekend.  Last HIV test per patient/review of record was never  Patient reports last pap was never  See flowsheet for further details and programmatic requirements.    The following portions of the patient's history were reviewed and updated as appropriate: allergies, current medications, past medical history, past social history, past surgical history and problem list.  Objective:  There were no vitals filed for this visit.  Physical Exam Vitals and nursing note  reviewed.  Constitutional:      Appearance: Normal appearance. She is obese.  HENT:     Head: Normocephalic and atraumatic.     Mouth/Throat:     Mouth: Mucous membranes are moist.     Pharynx: Oropharynx is clear. No oropharyngeal exudate or posterior oropharyngeal erythema.  Eyes:     Conjunctiva/sclera: Conjunctivae normal.  Pulmonary:     Effort: Pulmonary effort is normal.  Abdominal:     Palpations: Abdomen is soft. There is no mass.     Tenderness: There is no abdominal tenderness. There is no rebound.     Comments: Poor tone, soft without masses or tenderness, +inguinal lymphadenopathy with tenderness  Genitourinary:    General: Normal vulva.     Exam position: Lithotomy position.     Pubic Area: No rash or pubic lice.      Labia:        Right: No rash or lesion.        Left: No rash or lesion.      Vagina: Bleeding (red menses blood, ph>4.5) present. No vaginal discharge, erythema or lesions.     Cervix: Lesion (white lesion on anterior aspect of cx that can be wiped away somewhat ~ 4 mm) present. No cervical motion tenderness, discharge, friability or erythema.     Uterus: Normal.      Adnexa: Right adnexa normal and left adnexa normal.     Rectum: Normal.          Comments: HSV, GC/Chlamydia cultures done  Moderate red menses blood present in vagina At posterior aspect of vaginal introitus is 3 small areas where skin has been removed and is sore (pt states she wiped with a towel really hard because it itched) Lymphadenopathy:     Head:     Right side of head: No preauricular or posterior auricular adenopathy.     Left side of head: No preauricular or posterior auricular adenopathy.     Cervical: No cervical adenopathy.     Right cervical: No superficial, deep or posterior cervical adenopathy.    Left cervical: No superficial, deep or posterior cervical adenopathy.     Upper Body:     Right upper body: No supraclavicular or axillary adenopathy.     Left upper  body: No supraclavicular or axillary adenopathy.     Lower Body: No right inguinal adenopathy. No left inguinal adenopathy.  Skin:    General: Skin is warm and dry.     Findings: Rash (pt states "rash" x 1 week on tops of hands and forearms with no erythema, hyperpigmented, no head, not raised, ~3 mm) present.  Neurological:     Mental Status: She is alert and oriented to person, place, and time.     Assessment and Plan:  Megan Barnes is a 25 y.o. female presenting to the [facility] Department for STI screening  1. Morbid obesity (HCC) 236 lbs   2. Screening examination for venereal disease Please treat as contact to syphillis (states partner was seen at urgent care and then here on 01/27/21 and treated for syphyllis): pt allergic to PCN Needs syphyllis contact card Treat wet mount per standing orders Immunization nurse consult - WET PREP FOR TRICH, YEAST, CLUE - Pregnancy, urine - Chlamydia/Gonorrhea [facility] - Syphilis Serology, [facility] - HIV/HCV [facility] - HSV Culture, No Typing     No follow-ups on file.  Future Appointments  Date Time Provider Department Center  01/29/2021  2:00 PM Jamillah Camilo, Austin Miles, CNM [facility] None    Alberteen Spindle, CNM

## 2021-02-02 LAB — GONOCOCCUS CULTURE

## 2021-02-09 ENCOUNTER — Telehealth: Payer: Self-pay | Admitting: Physician Assistant

## 2021-02-09 ENCOUNTER — Telehealth: Payer: Self-pay

## 2021-02-09 NOTE — Telephone Encounter (Signed)
Call to patient at 5:40 pm today.  Verified patient with Password and DOB.  Patient has questions about test results.  Patient states that she was a contact to Syphilis and had a rash when she was here.  Reports that she was given Doxycycline and the rash has resolved so she is confused about the result that her Syphilis test was negative but the HSV test was positive.  Counseled patient that Syphilis has a 90 day incubation period and that she may have had Syphilis but the results are negative because her body had not made enough antibodies to change the test from negative to positive.  Counseled that since she has been/is being treated, the test is not likely to change since it hadn't prior to treatment.  Counseled that if she had not gotten treated as a contact she would have been asked to RTC at least one more time to test her blood again after the incubation period was up and then she may have had to be treated.  Counseled patient re: HSV- type 1 vs type 2, transmission, cyclic nature, subclinical disease and treatment options.  Patient states that she plans to keep her appointment on 02/11/21, and would like her blood retested at that time for Syphilis.  Counseled that she can think about treatment options and discuss further with the provider that she sees at that appointment.  Patient states that she can not think of other questions at this time and enc patient to write down any others that she thinks of to bring with her.

## 2021-02-09 NOTE — Telephone Encounter (Signed)
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 LMTRC,pt did call back. Pt is wondering if these results are right ,because her partner said that they did not have this .She was also treated for syphilis. She said this is very upsetting . Scheduled an appt with provider  10/13 at 1pm to discuss .

## 2021-02-11 ENCOUNTER — Ambulatory Visit: Payer: Self-pay | Admitting: Family Medicine

## 2021-02-11 ENCOUNTER — Other Ambulatory Visit: Payer: Self-pay

## 2021-02-11 DIAGNOSIS — B009 Herpesviral infection, unspecified: Secondary | ICD-10-CM

## 2021-02-11 DIAGNOSIS — Z708 Other sex counseling: Secondary | ICD-10-CM

## 2021-02-11 MED ORDER — ACYCLOVIR 800 MG PO TABS: 800.0000 mg | ORAL_TABLET | Freq: Two times a day (BID) | ORAL | 12 refills | Status: DC

## 2021-02-15 DIAGNOSIS — B009 Herpesviral infection, unspecified: Secondary | ICD-10-CM | POA: Insufficient documentation

## 2021-02-15 NOTE — Progress Notes (Signed)
Pt in clinic today to discuss test results.  Pt was seen on 01/29/2021 as as contact to syphilis.  Patient was tested and treated with Doxycycline d/t allergies.  Pt was also screened for other sexually transmitted infections on that date.    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   Patient had questions and was contacted by C.[place], [place] on 02/09/21.  See documentation.    Patient had questions on why syphilis test result was negative and HSV+ but her partners test  was negative for HSV an + for syphilis.    Discussed with patient that syphilis test may have been in early stages and not able to be detected at that time of testing and because she was now treated that testing at this time may not give accurate test results.    Discussed that her HSV test came back positive from a open lesion that a sample was collected from.  Discussed with patient medication management for HSV.  Pt chooses to take medication episodically.  Questions answered.  RX sent to requested pharmacy.    Pt to call if any questions or concerns.     Wendi Snipes, FNP

## 2021-09-15 ENCOUNTER — Emergency Department: Payer: BC Managed Care – PPO

## 2021-09-15 ENCOUNTER — Encounter: Payer: Self-pay | Admitting: Emergency Medicine

## 2021-09-15 ENCOUNTER — Emergency Department
Admission: EM | Admit: 2021-09-15 | Discharge: 2021-09-15 | Disposition: A | Discharge status: Home / Self Care | Payer: BC Managed Care – PPO | Attending: Emergency Medicine | Admitting: Emergency Medicine

## 2021-09-15 ENCOUNTER — Other Ambulatory Visit: Payer: Self-pay

## 2021-09-15 DIAGNOSIS — R0602 Shortness of breath: Secondary | ICD-10-CM | POA: Diagnosis present

## 2021-09-15 DIAGNOSIS — J45901 Unspecified asthma with (acute) exacerbation: Secondary | ICD-10-CM | POA: Insufficient documentation

## 2021-09-15 DIAGNOSIS — Z20822 Contact with and (suspected) exposure to covid-19: Secondary | ICD-10-CM | POA: Diagnosis not present

## 2021-09-15 LAB — BASIC METABOLIC PANEL
Anion gap: 8 (ref 5–15)
BUN: 19 mg/dL (ref 6–20)
CO2: 23 mmol/L (ref 22–32)
Calcium: 9.1 mg/dL (ref 8.9–10.3)
Chloride: 107 mmol/L (ref 98–111)
Creatinine, Ser: 0.77 mg/dL (ref 0.44–1.00)
GFR, Estimated: >60 mL/min (ref >60)
Glucose, Bld: 116 mg/dL — ABNORMAL HIGH (ref 70–99)
Potassium: 4 mmol/L (ref 3.5–5.1)
Sodium: 138 mmol/L (ref 135–145)

## 2021-09-15 LAB — CBC WITH DIFFERENTIAL/PLATELET
Abs Immature Granulocytes: 0.04 10*3/uL (ref 0.00–0.07)
Basophils Absolute: 0 10*3/uL (ref 0.0–0.1)
Basophils Relative: 0 %
Eosinophils Absolute: 0.3 10*3/uL (ref 0.0–0.5)
Eosinophils Relative: 3 %
HCT: 38.2 % (ref 36.0–46.0)
Hemoglobin: 11.9 g/dL — ABNORMAL LOW (ref 12.0–15.0)
Immature Granulocytes: 0 %
Lymphocytes Relative: 15 %
Lymphs Abs: 1.4 10*3/uL (ref 0.7–4.0)
MCH: 26.8 pg (ref 26.0–34.0)
MCHC: 31.2 g/dL (ref 30.0–36.0)
MCV: 86 fL (ref 80.0–100.0)
Monocytes Absolute: 0.2 10*3/uL (ref 0.1–1.0)
Monocytes Relative: 2 %
Neutro Abs: 7.7 10*3/uL (ref 1.7–7.7)
Neutrophils Relative %: 80 %
Platelets: 245 10*3/uL (ref 150–400)
RBC: 4.44 MIL/uL (ref 3.87–5.11)
RDW: 14.6 % (ref 11.5–15.5)
WBC: 9.7 10*3/uL (ref 4.0–10.5)
nRBC: 0 % (ref 0.0–0.2)

## 2021-09-15 LAB — D-DIMER, QUANTITATIVE: D-Dimer, Quant: 0.36 ug/mL-FEU (ref 0.00–0.50)

## 2021-09-15 LAB — RESP PANEL BY RT-PCR (FLU A&B, COVID) ARPGX2
Influenza A by PCR: NEGATIVE
Influenza B by PCR: NEGATIVE
SARS Coronavirus 2 by RT PCR: NEGATIVE

## 2021-09-15 LAB — PREGNANCY, URINE: Preg Test, Ur: NEGATIVE

## 2021-09-15 LAB — TROPONIN I (HIGH SENSITIVITY): Troponin I (High Sensitivity): <2 ng/L (ref <18)
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
## 2021-09-15 MED ORDER — METHYLPREDNISOLONE SODIUM SUCC 125 MG IJ SOLR
125.0000 mg | Freq: Once | INTRAMUSCULAR | Status: AC
  Administered 2021-09-15: 125 mg via INTRAVENOUS
  Filled 2021-09-15: qty 2

## 2021-09-15 MED ORDER — PREDNISONE 20 MG PO TABS: 60.0000 mg | ORAL_TABLET | Freq: Every day | ORAL | 0 refills | Status: AC

## 2021-09-15 MED ORDER — ALBUTEROL SULFATE (2.5 MG/3ML) 0.083% IN NEBU
10.0000 mg | INHALATION_SOLUTION | Freq: Once | RESPIRATORY_TRACT | Status: AC
  Administered 2021-09-15: 10 mg via RESPIRATORY_TRACT
  Filled 2021-09-15: qty 12

## 2021-09-15 MED ORDER — ALBUTEROL SULFATE (2.5 MG/3ML) 0.083% IN NEBU
10.0000 mg | INHALATION_SOLUTION | Freq: Once | RESPIRATORY_TRACT | Status: DC
Start: 2021-09-15 — End: 2021-09-15

## 2021-09-15 MED ORDER — ALBUTEROL SULFATE HFA 108 (90 BASE) MCG/ACT IN AERS: 2.0000 | INHALATION_SPRAY | Freq: Four times a day (QID) | RESPIRATORY_TRACT | 2 refills | Status: DC | PRN

## 2021-09-15 MED ORDER — PREDNISONE 10 MG PO TABS: 10.0000 mg | ORAL_TABLET | Freq: Every day | ORAL | 0 refills | Status: DC

## 2021-09-15 MED ORDER — IPRATROPIUM-ALBUTEROL 0.5-2.5 (3) MG/3ML IN SOLN
RESPIRATORY_TRACT | Status: AC
  Administered 2021-09-15: 3 mL
  Filled 2021-09-15: qty 9

## 2021-09-15 MED ORDER — MAGNESIUM SULFATE 2 GM/50ML IV SOLN
2.0000 g | Freq: Once | INTRAVENOUS | Status: AC
  Administered 2021-09-15: 2 g via INTRAVENOUS
  Filled 2021-09-15: qty 50

## 2021-09-15 NOTE — ED Provider Notes (Signed)
? ?[facility] ?Provider Note ? ? ? Event Date/Time  ? First MD Initiated Contact with Patient 09/15/21 0405   ?  (approximate) ? ? ?History  ? ?Asthma ? ? ?HPI ? ?Megan Barnes is a 26 y.o. female history of asthma who presents for evaluation of shortness of breath.  Patient reports 2 days of progressively worsening cough productive of yellow sputum, shortness of breath and wheezing.  Has been using her inhalers however she ran out of it this evening.  She arrives to the ED complaining of severe shortness of breath.  No fever or chills.  No chest pain.  LMP was last week.  No prior history of PE or DVT, recent travel immobilization, leg pain or swelling or exogenous hormones ?  ? ? ?Past Medical History:  ?Diagnosis Date  ? Abdominal pain   ? Anemia   ? Asthma   ? Depression   ? Pneumonia   ? ? ?Past Surgical History:  ?Procedure Laterality Date  ? CHOLECYSTECTOMY N/A 03/05/2015  ? Procedure: LAPAROSCOPIC CHOLECYSTECTOMY ;  Surgeon: Lattie Haw, MD;  Location: [facility] ORS;  Service: General;  Laterality: N/A;  ? POLYDACTYLY RECONSTRUCTION    ? ? ? ?Physical Exam  ? ?Triage Vital Signs: ?ED Triage Vitals  ?Enc Vitals Group  ?   BP 09/15/21 0405 (!) 143/56  ?   Pulse Rate 09/15/21 0405 (!) 101  ?   Resp 09/15/21 0405 (!) 32  ?   Temp 09/15/21 0405 98.2 ?F (36.8 ?C)  ?   Temp Source 09/15/21 0405 Oral  ?   SpO2 09/15/21 0405 92 %  ?   Weight 09/15/21 0406 210 lb (95.3 kg)  ?   Height 09/15/21 0406 5\' 8"  (1.727 m)  ?   Head Circumference --   ?   Peak Flow --   ?   Pain Score 09/15/21 0406 0  ?   Pain Loc --   ?   Pain Edu? --   ?   Excl. in GC? --   ? ? ?Most recent vital signs: ?Vitals:  ? 09/15/21 0615 09/15/21 0630  ?BP: 140/75 126/66  ?Pulse: 83 81  ?Resp: 15 17  ?Temp:    ?SpO2: 93% 100%  ? ? ? ?Constitutional: Alert and oriented.  Mild respiratory distress ?HEENT: ?     Head: Normocephalic and atraumatic.    ?     Eyes: Conjunctivae are normal. Sclera is non-icteric.  ?      Mouth/Throat: Mucous membranes are moist.  ?     Neck: Supple with no signs of meningismus. ?Cardiovascular: Regular rate and rhythm. No murmurs, gallops, or rubs. 2+ symmetrical distal pulses are present in all extremities.  ?Respiratory: Mild respiratory distress, satting 90 to 92% at rest, diffuse wheezing bilaterally with decreased air movement ?Gastrointestinal: Soft, non tender, and non distended with positive bowel sounds. No rebound or guarding. ?Genitourinary: No CVA tenderness. ?Musculoskeletal:  No edema, cyanosis, or erythema of extremities. ?Neurologic: Normal speech and language. Face is symmetric. Moving all extremities. No gross focal neurologic deficits are appreciated. ?Skin: Skin is warm, dry and intact. No rash noted. ?Psychiatric: Mood and affect are normal. Speech and behavior are normal. ? ?ED Results / Procedures / Treatments  ? ?Labs ?(all labs ordered are listed, but only abnormal results are displayed) ?Labs Reviewed  ?CBC WITH DIFFERENTIAL/PLATELET - Abnormal; Notable for the following components:  ?    Result Value  ? Hemoglobin 11.9 (*)   ?  All other components within normal limits  ?BASIC METABOLIC PANEL - Abnormal; Notable for the following components:  ? Glucose, Bld 116 (*)   ? All other components within normal limits  ?RESP PANEL BY RT-PCR (FLU A&B, COVID) ARPGX2  ?D-DIMER, QUANTITATIVE  ?PREGNANCY, URINE  ?TROPONIN I (HIGH SENSITIVITY)  ? ? ? ?EKG ? ?ED ECG REPORT ?I, Nita Sicklearolina Foye Haggart, the attending physician, personally viewed and interpreted this ECG. ? ?Sinus rhythm with a rate of 81, normal),, no ST elevations or rashes. ? ?RADIOLOGY ?I, Nita Sicklearolina Naika Noto, attending MD, have personally viewed and interpreted the images obtained during this visit as below: ? ?Chest x-ray negative for edema or pneumonia or pneumothorax ? ? ?___________________________________________________ ?Interpretation by Radiologist:  ?DG Chest Portable 1 View ? ?Result Date: 09/15/2021 ?CLINICAL DATA:   [age] year old female with history of cough and wheezing. EXAM: PORTABLE CHEST 1 VIEW COMPARISON:  Chest x-ray 07/03/2020. FINDINGS: Lung volumes are normal. No consolidative airspace disease. No pleural effusions. No pneumothorax. No pulmonary nodule or mass noted. Pulmonary vasculature and the cardiomediastinal silhouette are within normal limits. IMPRESSION: No radiographic evidence of acute cardiopulmonary disease. Electronically Signed   By: Trudie Reedaniel  Entrikin M.D.   On: 09/15/2021 05:03   ? ? ? ? ?PROCEDURES: ? ?Critical Care performed: Yes, see critical care procedure note(s) ? ?.Critical Care ?Performed by: Nita SickleVeronese, [place], MD ?Authorized by: Nita SickleVeronese, [place], MD  ? ?Critical care provider statement:  ?  Critical care time (minutes):  30 ?  Critical care time was exclusive of:  Separately billable procedures and treating other patients ?  Critical care was necessary to treat or prevent imminent or life-threatening deterioration of the following conditions:  Respiratory failure, circulatory failure, cardiac failure and CNS failure or compromise ?  Critical care was time spent personally by me on the following activities:  Development of treatment plan with patient or surrogate, discussions with consultants, evaluation of patient's response to treatment, examination of patient, ordering and review of laboratory studies, ordering and review of radiographic studies, ordering and performing treatments and interventions, pulse oximetry, re-evaluation of patient's condition and review of old charts ?  I assumed direction of critical care for this patient from another provider in my specialty: no   ?  Care discussed with: admitting provider   ? ? ? ?IMPRESSION / MDM / ASSESSMENT AND PLAN / ED COURSE  ?I reviewed the triage vital signs and the nursing notes. ? ?26 y.o. female history of asthma who presents for evaluation of shortness of breath, wheezing in the setting of 2 days of productive cough.  Running out of  her inhaler tonight.  She arrives in mild respiratory distress, satting 90 to 92%, diffuse wheezing with diminished air movement bilaterally. ? ?Ddx: Asthma exacerbation versus pneumonia versus viral syndrome.  Possibly PE but patient denies any pleuritic chest pain and any other risk factors for PE ? ? ?Plan: DuoNeb x3, IV magnesium, IV Solu-Medrol, chest x-ray, COVID and flu swab.  Patient was placed on telemetry for close monitoring of cardiorespiratory status ? ? ?MEDICATIONS GIVEN IN ED: ?Medications  ?ipratropium-albuterol (DUONEB) 0.5-2.5 (3) MG/3ML nebulizer solution (3 mLs  Given 09/15/21 0410)  ?magnesium sulfate IVPB 2 g 50 mL (0 g Intravenous Stopped 09/15/21 0421)  ?methylPREDNISolone sodium succinate (SOLU-MEDROL) 125 mg/2 mL injection 125 mg (125 mg Intravenous Given 09/15/21 0409)  ?albuterol (PROVENTIL) (2.5 MG/3ML) 0.083% nebulizer solution 10 mg (10 mg Nebulization Given 09/15/21 0618)  ? ? ? ?ED COURSE: X-ray negative for pneumonia, pneumothorax or edema.  COVID and flu negative.  Patient was evaluated and still feels short of breath and is complaining of chest tightness and upper back tightness.  She still has wheezing.  Sats are in the mid 90s on room air.  We will put patient on 10 mg of albuterol an hour for 1 hour.  We will get an EKG, troponin, D-dimer, and basic labs to rule out more serious etiology such as PE, myocarditis, pericarditis. ? ? ?_________________________ ?7:21 AM on 09/15/2021 ?----------------------------------------- ?EKG and troponin with no signs of myocarditis or pericarditis.  D-dimer is negative.  Labs otherwise with no acute findings, no leukocytosis or signs of sepsis.  Pregnancy test is negative.  Patient is currently receiving 10 mg of albuterol continuous for an hour.  Plan to reassess after that for disposition.  Care transferred to Dr. Lenard Lance. ? ?Consults: none ? ? ?EMR reviewed including patient's last visit with primary care doctor for a viral syndrome from  a month ago ? ? ? ?FINAL CLINICAL IMPRESSION(S) / ED DIAGNOSES  ? ?Final diagnoses:  ?Exacerbation of asthma, unspecified asthma severity, unspecified whether persistent  ? ? ? ?Rx / DC Orders  ? ?ED Discharge Order

## 2021-09-15 NOTE — ED Notes (Signed)
Port CXR performed 

## 2021-09-15 NOTE — ED Notes (Signed)
Assisted pt to the restroom to obtain urine sample ?

## 2021-09-15 NOTE — ED Provider Notes (Signed)
----------------------------------------- ?  10:05 AM on 09/15/2021 ?----------------------------------------- ?Patient care assumed from Dr. Dolores Frame.  Patient continues to feel chest tightness.  On my evaluation patient has mild inspiratory and expiratory wheezes.  Given the continuation of feeling of chest tightness I did discuss with the patient admission to the hospital, she states she strongly wishes to try to go home.  We will repeat an albuterol nebulizer and recheck an hour or so after she finishes this treatment to see if the patient would be able to go home.  During my evaluation of the patient she was satting 93% on room air with a good waveform. ? ?After repeat albuterol nebulizer.  Patient states she is feeling better.  On examination she no longer has inspiratory wheezes but does still have mild expiratory wheeze.  Patient now satting 94-95% at times as high as 97%.  Patient strongly wishes to go home.  We will prescribe albuterol, prednisone.  I discussed with the patient if she begins feeling worse or short of breath she needs to return to the emergency department.  Patient is agreeable to plan of care. ?  ?Minna Antis, MD ?09/15/21 1128 ? ?

## 2021-09-15 NOTE — ED Triage Notes (Addendum)
Pt to room 5, tachypnic, diminished BS,; st ran out of her inhaler and for last 2 days having wheezing, SHOB and prod cough yellow sputum; with ambulation, pulse ox 90-92% and at rest 94% on ra ?

## 2021-11-01 ENCOUNTER — Other Ambulatory Visit: Payer: Self-pay

## 2021-11-01 ENCOUNTER — Encounter: Payer: Self-pay | Admitting: Intensive Care

## 2021-11-01 ENCOUNTER — Emergency Department
Admission: EM | Admit: 2021-11-01 | Discharge: 2021-11-01 | Disposition: A | Discharge status: Home / Self Care | Payer: BC Managed Care – PPO | Attending: Emergency Medicine | Admitting: Emergency Medicine

## 2021-11-01 DIAGNOSIS — J4521 Mild intermittent asthma with (acute) exacerbation: Secondary | ICD-10-CM | POA: Diagnosis not present

## 2021-11-01 DIAGNOSIS — R0600 Dyspnea, unspecified: Secondary | ICD-10-CM | POA: Diagnosis present

## 2021-11-01 MED ORDER — IPRATROPIUM-ALBUTEROL 0.5-2.5 (3) MG/3ML IN SOLN
3.0000 mL | Freq: Once | RESPIRATORY_TRACT | Status: AC
  Administered 2021-11-01: 3 mL via RESPIRATORY_TRACT
  Filled 2021-11-01: qty 3

## 2021-11-01 MED ORDER — PREDNISONE 20 MG PO TABS
60.0000 mg | ORAL_TABLET | Freq: Once | ORAL | Status: AC
  Administered 2021-11-01: 60 mg via ORAL
  Filled 2021-11-01: qty 3

## 2021-11-01 MED ORDER — ALBUTEROL SULFATE HFA 108 (90 BASE) MCG/ACT IN AERS
2.0000 | INHALATION_SPRAY | Freq: Four times a day (QID) | RESPIRATORY_TRACT | 2 refills | Status: DC | PRN
Start: 2021-11-01 — End: 2021-12-03

## 2021-11-01 MED ORDER — PREDNISONE 50 MG PO TABS: 50.0000 mg | ORAL_TABLET | Freq: Every day | ORAL | 0 refills | Status: AC

## 2021-11-01 NOTE — ED Provider Notes (Signed)
[facility] Provider Note    Event Date/Time   First MD Initiated Contact with Patient 11/01/21 1618     (approximate)   History   Chief Complaint Asthma   HPI Megan Barnes is a 26 y.o. female, history of asthma, depression, morbid obesity, depression, presents to the emergency department for evaluation of suspected asthma exacerbation.  Patient states she has been struggling with her asthma for the past 2 days.  She reports difficulty taking a deep breath and has had some discomfort earlier today.  She reports mild improvement in her symptoms since arriving to the emergency department, however continues to feels discomfort.  Denies fever/chills, chest pain, abdominal pain, flank pain, nausea/vomiting, diarrhea, urinary symptoms, rash/lesions, hemoptysis, or dizziness/lightheadedness.  History Limitations: No limitations.        Physical Exam  Triage Vital Signs: ED Triage Vitals  Enc Vitals Group     BP 11/01/21 1547 (!) 160/98     Pulse Rate 11/01/21 1547 (!) 108     Resp 11/01/21 1547 20     Temp 11/01/21 1547 97.8 F (36.6 C)     Temp Source 11/01/21 1547 Oral     SpO2 11/01/21 1547 95 %     Weight 11/01/21 1548 227 lb (103 kg)     Height 11/01/21 1548 5\' 8"  (1.727 m)     Head Circumference --      Peak Flow --      Pain Score 11/01/21 1548 7     Pain Loc --      Pain Edu? --      Excl. in GC? --     Most recent vital signs: Vitals:   11/01/21 1547 11/01/21 1900  BP: (!) 160/98 125/67  Pulse: (!) 108 81  Resp: 20 16  Temp: 97.8 F (36.6 C)   SpO2: 95% 98%    General: Awake, NAD.  Speaking in full sentences. Skin: Warm, dry. No rashes or lesions.  Eyes: PERRL. Conjunctivae normal.  CV: Good peripheral perfusion.  Resp: Normal effort.  Expiratory wheezing present bilaterally in the apices and bases. Abd: Soft, non-tender. No distention.  Neuro: At baseline. No gross neurological deficits.    Focused Exam: N/A.  Physical  Exam    ED Results / Procedures / Treatments  Labs (all labs ordered are listed, but only abnormal results are displayed) Labs Reviewed - No data to display   EKG N/A.   RADIOLOGY  ED Provider Interpretation: N/A.  No results found.  PROCEDURES:  Critical Care performed: N/A.  Procedures    MEDICATIONS ORDERED IN ED: Medications  ipratropium-albuterol (DUONEB) 0.5-2.5 (3) MG/3ML nebulizer solution 3 mL (3 mLs Nebulization Given 11/01/21 1631)  predniSONE (DELTASONE) tablet 60 mg (60 mg Oral Given 11/01/21 1631)  ipratropium-albuterol (DUONEB) 0.5-2.5 (3) MG/3ML nebulizer solution 3 mL (3 mLs Nebulization Given 11/01/21 1801)  ipratropium-albuterol (DUONEB) 0.5-2.5 (3) MG/3ML nebulizer solution 3 mL (3 mLs Nebulization Given 11/01/21 1927)     IMPRESSION / MDM / ASSESSMENT AND PLAN / ED COURSE  I reviewed the triage vital signs and the nursing notes.                              Differential diagnosis includes, but is not limited to, asthma exacerbation, bronchitis, viral respiratory illness.  ED Course Patient appears well, is within normal limits.  Speaking in full sentences.  Physical exam does show expiratory wheezing.  We will go ahead and treat with DuoNebs and prednisone 60 mg p.o.  Assessment/Plan Presentation consistent with asthma exacerbation.  Low suspicion for any viral or bacterial infection given her presentation.  Treated here with DuoNebs and p.o. prednisone.  Her wheezing improved significantly following treatment.  Patient states that she feels safe going home.  We will provide her with a short-term prescription for prednisone as well as a refill of her albuterol inhaler.  Encouraged her to continue following up with her primary care provider as needed.  We will plan to discharge.  Patient's presentation is most consistent with acute, uncomplicated illness.   Provided the patient with anticipatory guidance, return precautions, and educational material.  Encouraged the patient to return to the emergency department at any time if they begin to experience any new or worsening symptoms. Patient expressed understanding and agreed with the plan.       FINAL CLINICAL IMPRESSION(S) / ED DIAGNOSES   Final diagnoses:  Mild intermittent asthma with exacerbation     Rx / DC Orders   ED Discharge Orders          Ordered    predniSONE (DELTASONE) 50 MG tablet  Daily with breakfast        11/01/21 2011    albuterol (VENTOLIN HFA) 108 (90 Base) MCG/ACT inhaler  Every 6 hours PRN        11/01/21 2011             Note:  This document was prepared using Dragon voice recognition software and may include unintentional dictation errors.   Varney Daily, [place] 11/01/21 2016    Minna Antis, MD 11/01/21 2337

## 2021-11-01 NOTE — ED Triage Notes (Addendum)
Patient reports struggling with her asthma the last two days and today worsened. Patient feels like she cannot take a deep breath and discomfort with a breath. Uses in haler at home with some relief. States she ate a marijuana gummy last night

## 2021-11-01 NOTE — ED Notes (Signed)
See triage note  presents with some SOB and wheezing   States last used her inhaler PTA  denies any fever   but has had slight cough

## 2021-11-01 NOTE — Discharge Instructions (Addendum)
-  Continue to take your prednisone as prescribed.  Utilize your albuterol inhaler as needed.  -Follow-up with your primary care provider for ongoing care of your asthma, as discussed.  -Return to the emergency department anytime if you begin to experience any new or worsening symptoms.

## 2021-12-03 ENCOUNTER — Emergency Department
Admission: EM | Admit: 2021-12-03 | Discharge: 2021-12-03 | Disposition: A | Discharge status: Home / Self Care | Payer: BC Managed Care – PPO | Attending: Emergency Medicine | Admitting: Emergency Medicine

## 2021-12-03 ENCOUNTER — Encounter: Payer: Self-pay | Admitting: Emergency Medicine

## 2021-12-03 ENCOUNTER — Emergency Department: Payer: BC Managed Care – PPO

## 2021-12-03 ENCOUNTER — Other Ambulatory Visit: Payer: Self-pay

## 2021-12-03 DIAGNOSIS — J45901 Unspecified asthma with (acute) exacerbation: Secondary | ICD-10-CM | POA: Diagnosis not present

## 2021-12-03 DIAGNOSIS — R0602 Shortness of breath: Secondary | ICD-10-CM | POA: Diagnosis present

## 2021-12-03 DIAGNOSIS — D72829 Elevated white blood cell count, unspecified: Secondary | ICD-10-CM | POA: Diagnosis not present

## 2021-12-03 DIAGNOSIS — Z20822 Contact with and (suspected) exposure to covid-19: Secondary | ICD-10-CM | POA: Insufficient documentation

## 2021-12-03 LAB — CBC WITH DIFFERENTIAL/PLATELET
Abs Immature Granulocytes: 0.03 K/uL (ref 0.00–0.07)
Basophils Absolute: 0.1 K/uL (ref 0.0–0.1)
Basophils Relative: 1 %
Eosinophils Absolute: 1.1 K/uL — ABNORMAL HIGH (ref 0.0–0.5)
Eosinophils Relative: 10 %
HCT: 42.2 % (ref 36.0–46.0)
Hemoglobin: 12.9 g/dL (ref 12.0–15.0)
Immature Granulocytes: 0 %
Lymphocytes Relative: 23 %
Lymphs Abs: 2.5 K/uL (ref 0.7–4.0)
MCH: 26 pg (ref 26.0–34.0)
MCHC: 30.6 g/dL (ref 30.0–36.0)
MCV: 84.9 fL (ref 80.0–100.0)
Monocytes Absolute: 0.6 K/uL (ref 0.1–1.0)
Monocytes Relative: 5 %
Neutro Abs: 6.7 K/uL (ref 1.7–7.7)
Neutrophils Relative %: 61 %
Platelets: 290 K/uL (ref 150–400)
RBC: 4.97 MIL/uL (ref 3.87–5.11)
RDW: 15.7 % — ABNORMAL HIGH (ref 11.5–15.5)
WBC: 11 K/uL — ABNORMAL HIGH (ref 4.0–10.5)
nRBC: 0 % (ref 0.0–0.2)

## 2021-12-03 LAB — RESP PANEL BY RT-PCR (FLU A&B, COVID) ARPGX2
Influenza A by PCR: NEGATIVE
Influenza B by PCR: NEGATIVE
SARS Coronavirus 2 by RT PCR: NEGATIVE

## 2021-12-03 LAB — BASIC METABOLIC PANEL WITH GFR
Anion gap: 7 (ref 5–15)
BUN: 11 mg/dL (ref 6–20)
CO2: 23 mmol/L (ref 22–32)
Calcium: 9.4 mg/dL (ref 8.9–10.3)
Chloride: 107 mmol/L (ref 98–111)
Creatinine, Ser: 0.82 mg/dL (ref 0.44–1.00)
GFR, Estimated: >60 mL/min
Glucose, Bld: 99 mg/dL (ref 70–99)
Potassium: 4 mmol/L (ref 3.5–5.1)
Sodium: 137 mmol/L (ref 135–145)

## 2021-12-03 MED ORDER — IPRATROPIUM-ALBUTEROL 0.5-2.5 (3) MG/3ML IN SOLN
3.0000 mL | Freq: Once | RESPIRATORY_TRACT | Status: AC
  Administered 2021-12-03: 3 mL via RESPIRATORY_TRACT
  Filled 2021-12-03: qty 3

## 2021-12-03 MED ORDER — ALBUTEROL SULFATE HFA 108 (90 BASE) MCG/ACT IN AERS: 2.0000 | INHALATION_SPRAY | Freq: Four times a day (QID) | RESPIRATORY_TRACT | 2 refills | Status: AC | PRN

## 2021-12-03 MED ORDER — PREDNISONE 10 MG PO TABS: ORAL_TABLET | ORAL | 0 refills | Status: AC

## 2021-12-03 MED ORDER — MAGNESIUM SULFATE 2 GM/50ML IV SOLN
2.0000 g | Freq: Once | INTRAVENOUS | Status: AC
  Administered 2021-12-03: 2 g via INTRAVENOUS
  Filled 2021-12-03: qty 50

## 2021-12-03 MED ORDER — IPRATROPIUM-ALBUTEROL 0.5-2.5 (3) MG/3ML IN SOLN
3.0000 mL | Freq: Once | RESPIRATORY_TRACT | Status: AC
Start: 2021-12-03 — End: 2021-12-03
  Administered 2021-12-03: 3 mL via RESPIRATORY_TRACT
  Filled 2021-12-03: qty 3

## 2021-12-03 MED ORDER — METHYLPREDNISOLONE SODIUM SUCC 125 MG IJ SOLR
125.0000 mg | Freq: Once | INTRAMUSCULAR | Status: AC
  Administered 2021-12-03: 125 mg via INTRAVENOUS
  Filled 2021-12-03: qty 2

## 2021-12-03 MED ORDER — FLUTICASONE-SALMETEROL 250-50 MCG/ACT IN AEPB: 1.0000 | INHALATION_SPRAY | Freq: Two times a day (BID) | RESPIRATORY_TRACT | 0 refills | Status: DC

## 2021-12-03 NOTE — Discharge Instructions (Addendum)
-  Please follow-up with your primary care provider/pulmonologist as discussed.  -Take the Wixela and prednisone as prescribed.  You may use the albuterol inhaler as needed for exacerbations.  -Return to the emergency department anytime if you begin to experience any new or worsening symptoms.

## 2021-12-03 NOTE — ED Provider Notes (Signed)
[facility] Provider Note    Event Date/Time   First MD Initiated Contact with Patient 12/03/21 1034     (approximate)   History   Chief Complaint Shortness of Breath   HPI Megan Barnes is a 26 y.o. female, history of asthma, depression, morbid obesity, marijuana use, presents to the emergency department for evaluation of suspected asthma attack.  Patient states that she has been struggling to keep her asthma under control lately, possibly due to lack of compliance with her daily Wixela, for which she ran out of a few months ago after not being able to afford the medication.  Endorses shortness of breath, no chest pain.  Denies any recent environmental exposures.  Denies abdominal pain, fever/chills, cough/sinus congestion, flank pain, nausea/vomiting, back pain, rash/lesions, or dizziness/lightheadedness.  History Limitations: No limitations.        Physical Exam  Triage Vital Signs: ED Triage Vitals  Enc Vitals Group     BP 12/03/21 1031 (!) 146/83     Pulse Rate 12/03/21 1031 (!) 105     Resp 12/03/21 1031 (!) 22     Temp 12/03/21 1031 98.1 F (36.7 C)     Temp src --      SpO2 12/03/21 1031 96 %     Weight 12/03/21 1032 220 lb (99.8 kg)     Height 12/03/21 1032 5\' 8"  (1.727 m)     Head Circumference --      Peak Flow --      Pain Score 12/03/21 1031 8     Pain Loc --      Pain Edu? --      Excl. in GC? --     Most recent vital signs: Vitals:   12/03/21 1224 12/03/21 1258  BP: 134/72 121/88  Pulse: 92 (!) 103  Resp: 20 20  Temp:    SpO2: 97% 96%    General: Awake, active labored breathing. Skin: Warm, dry. No rashes or lesions.  Eyes: PERRL. Conjunctivae normal.  CV: Good peripheral perfusion.  Resp: Labored breathing, wheezing noted bilaterally in the apices and bases. Abd: Soft, non-tender. No distention.  Neuro: At baseline. No gross neurological deficits.   Focused Exam: Throat exam unremarkable.  Physical  Exam    ED Results / Procedures / Treatments  Labs (all labs ordered are listed, but only abnormal results are displayed) Labs Reviewed  CBC WITH DIFFERENTIAL/PLATELET - Abnormal; Notable for the following components:      Result Value   WBC 11.0 (*)    RDW 15.7 (*)    Eosinophils Absolute 1.1 (*)    All other components within normal limits  RESP PANEL BY RT-PCR (FLU A&B, COVID) ARPGX2  BASIC METABOLIC PANEL     EKG Sinus rhythm, rate of 91, no segment changes, no AV blocks, rightward axis present, normal QRS interval, normal QT interval   RADIOLOGY  ED Provider Interpretation: I personally reviewed and interpreted this x-ray, no evidence of pneumonia. DG Chest 2 View  Result Date: 12/03/2021 CLINICAL DATA:  SOB EXAM: CHEST - 2 VIEW COMPARISON:  Chest x-ray Sep 15, 2021. FINDINGS: No consolidation. No visible pleural effusions or pneumothorax. Cardiomediastinal silhouette is within normal limits and similar to prior. IMPRESSION: No evidence of acute cardiopulmonary disease. Electronically Signed   By: Sep 17, 2021 M.D.   On: 12/03/2021 11:22    PROCEDURES:  Critical Care performed: N/A.  Procedures    MEDICATIONS ORDERED IN ED: Medications  ipratropium-albuterol (DUONEB) 0.5-2.5 (  3) MG/3ML nebulizer solution 3 mL (3 mLs Nebulization Given 12/03/21 1044)  methylPREDNISolone sodium succinate (SOLU-MEDROL) 125 mg/2 mL injection 125 mg (125 mg Intravenous Given 12/03/21 1044)  magnesium sulfate IVPB 2 g 50 mL (0 g Intravenous Stopped 12/03/21 1157)  ipratropium-albuterol (DUONEB) 0.5-2.5 (3) MG/3ML nebulizer solution 3 mL (3 mLs Nebulization Given 12/03/21 1054)  ipratropium-albuterol (DUONEB) 0.5-2.5 (3) MG/3ML nebulizer solution 3 mL (3 mLs Nebulization Given 12/03/21 1131)  ipratropium-albuterol (DUONEB) 0.5-2.5 (3) MG/3ML nebulizer solution 3 mL (3 mLs Nebulization Given 12/03/21 1225)     IMPRESSION / MDM / ASSESSMENT AND PLAN / ED COURSE  I reviewed the triage vital signs  and the nursing notes.                              Differential diagnosis includes, but is not limited to, asthma exacerbation, COVID-19, influenza, pneumonia.  ED Course Patient appears stable at this time, but does have noticeable labored breathing and endorsement of shortness of breath.  We will go ahead treat with DuoNebs, Solu-Medrol, and magnesium.  CBC shows leukocytosis at 11.0.  No anemia.  BMP shows no electrolyte abnormalities or AKI.    Respiratory panel negative for COVID-19 or influenza.  Upon reexamination, her wheezing as markedly improved.  She states that she feels much better.   Assessment/Plan Presentation consistent with asthma exacerbation.  She was treated here with multiple DuoNebs, Solu-Medrol, and magnesium sulfate with marked improvement.  She is clinically stable at this time.  Chest x-ray shows no evidence of pneumonia.  We will provide her with a refill of her Wixela (with discount card), as well as prescription for albuterol inhaler refill and prednisone taper.  Encouraged her to follow-up with her regular doctor/pulmonologist.  We will plan to discharge  Considered admission for this patient, but given her positive response to therapy and access to follow-up, she is unlikely to benefit from admission.  Provided the patient with anticipatory guidance, return precautions, and educational material. Encouraged the patient to return to the emergency department at any time if they begin to experience any new or worsening symptoms. Patient expressed understanding and agreed with the plan.   Patient's presentation is most consistent with acute presentation with potential threat to life or bodily function.       FINAL CLINICAL IMPRESSION(S) / ED DIAGNOSES   Final diagnoses:  Moderate asthma with exacerbation, unspecified whether persistent     Rx / DC Orders   ED Discharge Orders          Ordered    albuterol (VENTOLIN HFA) 108 (90 Base) MCG/ACT inhaler   Every 6 hours PRN        12/03/21 1222    fluticasone-salmeterol (WIXELA INHUB) 250-50 MCG/ACT AEPB  2 times daily        12/03/21 1305    predniSONE (DELTASONE) 10 MG tablet  Q breakfast        12/03/21 1305             Note:  This document was prepared using Dragon voice recognition software and may include unintentional dictation errors.   Varney Daily, [place] 12/03/21 1314    Concha Se, MD 12/04/21 [phone]

## 2021-12-03 NOTE — ED Triage Notes (Signed)
PT to ED via POV from home with active asthma attack. PT has not been sick but has been struggling to keep asthma under control as of late. Has used albuterol inhaler at home with no relief. PT having trouble answering questions. Endorses chest pain, dry cough heard. PT does not have ned tx at home.

## 2021-12-31 ENCOUNTER — Emergency Department: Payer: BC Managed Care – PPO

## 2021-12-31 ENCOUNTER — Other Ambulatory Visit: Payer: Self-pay

## 2021-12-31 ENCOUNTER — Emergency Department
Admission: EM | Admit: 2021-12-31 | Discharge: 2021-12-31 | Disposition: A | Discharge status: Home / Self Care | Payer: BC Managed Care – PPO | Attending: Emergency Medicine | Admitting: Emergency Medicine

## 2021-12-31 DIAGNOSIS — F17210 Nicotine dependence, cigarettes, uncomplicated: Secondary | ICD-10-CM | POA: Insufficient documentation

## 2021-12-31 DIAGNOSIS — R0981 Nasal congestion: Secondary | ICD-10-CM | POA: Insufficient documentation

## 2021-12-31 DIAGNOSIS — Z7951 Long term (current) use of inhaled steroids: Secondary | ICD-10-CM | POA: Insufficient documentation

## 2021-12-31 DIAGNOSIS — J4541 Moderate persistent asthma with (acute) exacerbation: Secondary | ICD-10-CM

## 2021-12-31 DIAGNOSIS — Z20822 Contact with and (suspected) exposure to covid-19: Secondary | ICD-10-CM | POA: Insufficient documentation

## 2021-12-31 DIAGNOSIS — J45909 Unspecified asthma, uncomplicated: Secondary | ICD-10-CM | POA: Diagnosis present

## 2021-12-31 LAB — RESP PANEL BY RT-PCR (FLU A&B, COVID) ARPGX2
Influenza A by PCR: NEGATIVE
Influenza B by PCR: NEGATIVE
SARS Coronavirus 2 by RT PCR: NEGATIVE

## 2021-12-31 MED ORDER — AZITHROMYCIN 250 MG PO TABS: ORAL_TABLET | ORAL | 0 refills | Status: DC

## 2021-12-31 MED ORDER — DEXAMETHASONE SODIUM PHOSPHATE 10 MG/ML IJ SOLN
10.0000 mg | Freq: Once | INTRAMUSCULAR | Status: AC
  Administered 2021-12-31: 10 mg via INTRAMUSCULAR
  Filled 2021-12-31: qty 1

## 2021-12-31 MED ORDER — MOMETASONE FURO-FORMOTEROL FUM 200-5 MCG/ACT IN AERO
2.0000 | INHALATION_SPRAY | Freq: Two times a day (BID) | RESPIRATORY_TRACT | Status: DC
  Administered 2021-12-31: 2 via RESPIRATORY_TRACT
  Filled 2021-12-31: qty 8.8

## 2021-12-31 MED ORDER — IPRATROPIUM-ALBUTEROL 0.5-2.5 (3) MG/3ML IN SOLN
3.0000 mL | Freq: Once | RESPIRATORY_TRACT | Status: AC
  Administered 2021-12-31: 3 mL via RESPIRATORY_TRACT
  Filled 2021-12-31: qty 3

## 2021-12-31 MED ORDER — IPRATROPIUM-ALBUTEROL 0.5-2.5 (3) MG/3ML IN SOLN: 3.0000 mL | RESPIRATORY_TRACT | 3 refills | Status: DC | PRN

## 2021-12-31 MED ORDER — PREDNISONE 10 MG (21) PO TBPK: ORAL_TABLET | ORAL | 0 refills | Status: DC

## 2021-12-31 NOTE — ED Provider Notes (Signed)
[facility] Provider Note    Event Date/Time   First MD Initiated Contact with Patient 12/31/21 [phone]     (approximate)   History   Asthma   HPI  Megan Barnes is a 26 y.o. female with history of asthma presents emergency department with a asthma attack.  States she used her inhaler at home but had very little relief.  States had slight congestion but she thinks that is due to the use of her albuterol inhaler.  States she is around a lot of secondhand smoke that she thinks is kicking it off.  She denies fever or chills.  No chest pain.      Physical Exam   Triage Vital Signs: ED Triage Vitals  Enc Vitals Group     BP 12/31/21 0808 138/88     Pulse Rate 12/31/21 0808 93     Resp 12/31/21 0808 20     Temp 12/31/21 0808 98.3 F (36.8 C)     Temp Source 12/31/21 0808 Oral     SpO2 12/31/21 0808 94 %     Weight 12/31/21 0809 210 lb (95.3 kg)     Height 12/31/21 0809 5\' 8"  (1.727 m)     Head Circumference --      Peak Flow --      Pain Score 12/31/21 0809 0     Pain Loc --      Pain Edu? --      Excl. in GC? --     Most recent vital signs: Vitals:   12/31/21 1000 12/31/21 1030  BP:    Pulse: 82 84  Resp:    Temp:    SpO2: 99% 99%     General: Awake, no distress.   CV:  Good peripheral perfusion. regular rate and  rhythm Resp:  Normal effort. Lungs with diffuse wheezing bilaterally Abd:  No distention.   Other:     ED Results / Procedures / Treatments   Labs (all labs ordered are listed, but only abnormal results are displayed) Labs Reviewed  RESP PANEL BY RT-PCR (FLU A&B, COVID) ARPGX2     EKG     RADIOLOGY Chest x-ray    PROCEDURES:   .Critical Care E&M  Performed by: 03/02/22, PA-C Critical care provider statement:    Critical care time (minutes):  60   Critical care time was exclusive of:  Separately billable procedures and treating other patients   Critical care was necessary to treat or prevent  imminent or life-threatening deterioration of the following conditions:  Respiratory failure   Critical care was time spent personally by me on the following activities:  Development of treatment plan with patient or surrogate, evaluation of patient's response to treatment, examination of patient, ordering and performing treatments and interventions, ordering and review of radiographic studies, pulse oximetry, re-evaluation of patient's condition and review of old charts After initial E/M assessment, critical care services were subsequently performed that were exclusive of separately billable procedures or treatment.      MEDICATIONS ORDERED IN ED: Medications  mometasone-formoterol (DULERA) 200-5 MCG/ACT inhaler 2 puff (2 puffs Inhalation Given 12/31/21 1034)  ipratropium-albuterol (DUONEB) 0.5-2.5 (3) MG/3ML nebulizer solution 3 mL (3 mLs Nebulization Given 12/31/21 0837)  ipratropium-albuterol (DUONEB) 0.5-2.5 (3) MG/3ML nebulizer solution 3 mL (3 mLs Nebulization Given 12/31/21 0837)  dexamethasone (DECADRON) injection 10 mg (10 mg Intramuscular Given 12/31/21 0932)  ipratropium-albuterol (DUONEB) 0.5-2.5 (3) MG/3ML nebulizer solution 3 mL (3 mLs Nebulization Given 12/31/21  0935)  ipratropium-albuterol (DUONEB) 0.5-2.5 (3) MG/3ML nebulizer solution 3 mL (3 mLs Nebulization Given 12/31/21 1101)     IMPRESSION / MDM / ASSESSMENT AND PLAN / ED COURSE  I reviewed the triage vital signs and the nursing notes.                              Differential diagnosis includes, but is not limited to, acute asthma exasperation, acute colitis, COVID  Patient's presentation is most consistent with acute presentation with potential threat to life or bodily function.   2 DuoNebs given, will get COVID test, chest x-ray   Chest x-ray individually reviewed and interpreted by me as having bronchial thickening.  No pneumonia.  On recheck of the patient she continues to have wheezing after the 2 DuoNebs.  We will  give her Decadron 10 mg IM along with a third DuoNeb.  Plan at this time is to discharge the patient home if she improves with the third DuoNeb.  We will place her on a twice daily inhaler along with DuoNeb Nebules with the machine.  Patient was given Advair 2 puffs here in the ED.  Inhaler was sent with the patient.  She was given a prescription for Z-Pak, steroid pack, DuoNeb Nebules and a DME prescription for nebulizer machine.  We had a strong discussion about secondhand cigarette smoke and that she needs to have a difficult conversation with her family about them smoking around her.  Explained to her that at some point she may end up in the intensive care unit with a tube down her throat secondary to asthma exasperation.  Patient is in agreement with treatment plan.  Strict instructions to return if worsening.  She was discharged stable condition.  FINAL CLINICAL IMPRESSION(S) / ED DIAGNOSES   Final diagnoses:  Moderate persistent asthma with exacerbation     Rx / DC Orders   ED Discharge Orders          Ordered    predniSONE (STERAPRED UNI-PAK 21 TAB) 10 MG (21) TBPK tablet        12/31/21 1136    ipratropium-albuterol (DUONEB) 0.5-2.5 (3) MG/3ML SOLN  Every 4 hours PRN        12/31/21 1136    For home use only DME Nebulizer machine        12/31/21 1136    azithromycin (ZITHROMAX Z-PAK) 250 MG tablet        12/31/21 1136             Note:  This document was prepared using Dragon voice recognition software and may include unintentional dictation errors.    Faythe Ghee, PA-C 12/31/21 1141    Dionne Bucy, MD 12/31/21 [phone]

## 2021-12-31 NOTE — ED Triage Notes (Signed)
Pt states she is here for an asthma attack- pt states she did try to use her inhaler at home but had little relief

## 2021-12-31 NOTE — ED Notes (Signed)
Pt tolerated the injection of Decadron in the left deltoid well.

## 2021-12-31 NOTE — Discharge Instructions (Signed)
Follow-up with your regular doctor as needed.  Return emergency department immediately if worsening.  If you feel that you cannot breathe please call 911.  Try and talk to your family members.  Secondhand smoke is only making you worse and you may end up in the intensive care unit with a tube down your throat due to your asthma.  Try a air purifier this may also help if you could put 1 in your bedroom.

## 2022-03-02 ENCOUNTER — Other Ambulatory Visit: Payer: Self-pay

## 2022-03-02 ENCOUNTER — Emergency Department
Admission: EM | Admit: 2022-03-02 | Discharge: 2022-03-02 | Disposition: A | Discharge status: Home / Self Care | Payer: BC Managed Care – PPO | Attending: Emergency Medicine | Admitting: Emergency Medicine

## 2022-03-02 ENCOUNTER — Encounter: Payer: Self-pay | Admitting: Emergency Medicine

## 2022-03-02 DIAGNOSIS — J4521 Mild intermittent asthma with (acute) exacerbation: Secondary | ICD-10-CM | POA: Diagnosis not present

## 2022-03-02 DIAGNOSIS — Z7951 Long term (current) use of inhaled steroids: Secondary | ICD-10-CM | POA: Insufficient documentation

## 2022-03-02 DIAGNOSIS — R0602 Shortness of breath: Secondary | ICD-10-CM | POA: Diagnosis present

## 2022-03-02 MED ORDER — ALBUTEROL SULFATE (2.5 MG/3ML) 0.083% IN NEBU
5.0000 mg | INHALATION_SOLUTION | Freq: Once | RESPIRATORY_TRACT | Status: AC
  Administered 2022-03-02: 5 mg via RESPIRATORY_TRACT
  Filled 2022-03-02: qty 6

## 2022-03-02 MED ORDER — PREDNISONE 20 MG PO TABS
60.0000 mg | ORAL_TABLET | Freq: Once | ORAL | Status: AC
  Administered 2022-03-02: 60 mg via ORAL
  Filled 2022-03-02: qty 3

## 2022-03-02 MED ORDER — IPRATROPIUM-ALBUTEROL 0.5-2.5 (3) MG/3ML IN SOLN
6.0000 mL | Freq: Once | RESPIRATORY_TRACT | Status: AC
  Administered 2022-03-02: 6 mL via RESPIRATORY_TRACT
  Filled 2022-03-02: qty 6

## 2022-03-02 MED ORDER — IPRATROPIUM-ALBUTEROL 0.5-2.5 (3) MG/3ML IN SOLN
3.0000 mL | Freq: Once | RESPIRATORY_TRACT | Status: AC
  Administered 2022-03-02: 3 mL via RESPIRATORY_TRACT
  Filled 2022-03-02: qty 3

## 2022-03-02 MED ORDER — PREDNISONE 20 MG PO TABS: 40.0000 mg | ORAL_TABLET | Freq: Every day | ORAL | 0 refills | Status: AC

## 2022-03-02 NOTE — ED Provider Notes (Signed)
[facility] Provider Note    Event Date/Time   First MD Initiated Contact with Patient 03/02/22 [phone]     (approximate)   History   Chief Complaint: Asthma   HPI  Chani Ghanem Schreiner is a 26 y.o. female with a past history of asthma who comes the ED complaining of shortness of breath and chest tightness since yesterday evening.  Constant, no aggravating or alleviating factors.  Tried her home nebulizer without relief.  No fever.  No significant sputum production.  No leg pain or swelling      Physical Exam   Triage Vital Signs: ED Triage Vitals  Enc Vitals Group     BP 03/02/22 0700 136/87     Pulse Rate 03/02/22 0700 (!) 122     Resp 03/02/22 0700 (!) 28     Temp 03/02/22 0700 97.8 F (36.6 C)     Temp Source 03/02/22 0700 Oral     SpO2 03/02/22 0700 98 %     Weight --      Height --      Head Circumference --      Peak Flow --      Pain Score 03/02/22 0658 0     Pain Loc --      Pain Edu? --      Excl. in [place]? --     Most recent vital signs: Vitals:   03/02/22 0830 03/02/22 0936  BP: 126/82 (!) 133/90  Pulse: 99 89  Resp: 18 18  Temp:    SpO2: 99% 99%    General: Awake, no distress.  CV:  Good peripheral perfusion.  Tachycardia, heart rate 110 Resp:  Normal effort.  Diffuse wheezing.  Markedly prolonged expiratory phase.  Symmetric air movement. Abd:  No distention.  Other:  No lower extremity edema erythema or asymmetric calf swelling   ED Results / Procedures / Treatments   Labs (all labs ordered are listed, but only abnormal results are displayed) Labs Reviewed - No data to display   EKG    RADIOLOGY    PROCEDURES:  Procedures   MEDICATIONS ORDERED IN ED: Medications  predniSONE (DELTASONE) tablet 60 mg (60 mg Oral Given 03/02/22 0718)  ipratropium-albuterol (DUONEB) 0.5-2.5 (3) MG/3ML nebulizer solution 3 mL (3 mLs Nebulization Given 03/02/22 0719)  albuterol (PROVENTIL) (2.5 MG/3ML) 0.083% nebulizer solution  5 mg (5 mg Nebulization Given 03/02/22 0719)  ipratropium-albuterol (DUONEB) 0.5-2.5 (3) MG/3ML nebulizer solution 6 mL (6 mLs Nebulization Given 03/02/22 0934)     IMPRESSION / MDM / ASSESSMENT AND PLAN / ED COURSE  I reviewed the triage vital signs and the nursing notes.                              Differential diagnosis includes, but is not limited to, asthma exacerbation, viral illness.  Doubt ACS PE dissection pericardial effusion pulmonary edema or pleural effusion  Patient's presentation is most consistent with severe exacerbation of chronic illness.  Patient presents with mild respiratory distress due to asthma exacerbation.  I think her tachycardia is due to beta agonist use this morning.  Tachypnea is related to her respiratory symptoms.  She is not hypoxic, not septic.  Afebrile with normal blood pressure.    Will give prednisone and bronchodilators and reassess.  ----------------------------------------- 10:33 AM on 03/02/2022 ----------------------------------------- After prednisone and initial bronchodilators, patient is feeling better.  She still has some persistent expiratory wheezing, but  oxygenation is normal, work of breathing is improved.  Patient was given additional DuoNebs, and continues to feel better.  We will continue her on outpatient prednisone, continue albuterol as needed, follow-up with primary care.       FINAL CLINICAL IMPRESSION(S) / ED DIAGNOSES   Final diagnoses:  Exacerbation of intermittent asthma, unspecified asthma severity     Rx / DC Orders   ED Discharge Orders          Ordered    predniSONE (DELTASONE) 20 MG tablet  Daily with breakfast        03/02/22 [id number]             Note:  This document was prepared using Dragon voice recognition software and may include unintentional dictation errors.   Carrie Mew, MD 03/02/22 1034

## 2022-03-02 NOTE — ED Triage Notes (Signed)
Patient ambulatory to triage with steady gait, without difficulty; prolonged expiration and labored breathing noted; Pt reports asthma exacerbation since last night; using nebs & inhaler without relief; denies any recent illness

## 2022-03-12 ENCOUNTER — Other Ambulatory Visit: Payer: Self-pay

## 2022-03-12 ENCOUNTER — Emergency Department: Payer: BC Managed Care – PPO

## 2022-03-12 ENCOUNTER — Emergency Department
Admission: EM | Admit: 2022-03-12 | Discharge: 2022-03-12 | Disposition: A | Discharge status: Home / Self Care | Payer: BC Managed Care – PPO | Attending: Emergency Medicine | Admitting: Emergency Medicine

## 2022-03-12 DIAGNOSIS — J4521 Mild intermittent asthma with (acute) exacerbation: Secondary | ICD-10-CM

## 2022-03-12 DIAGNOSIS — R0602 Shortness of breath: Secondary | ICD-10-CM

## 2022-03-12 DIAGNOSIS — Z20822 Contact with and (suspected) exposure to covid-19: Secondary | ICD-10-CM | POA: Diagnosis not present

## 2022-03-12 LAB — BASIC METABOLIC PANEL
Anion gap: 8 (ref 5–15)
BUN: 7 mg/dL (ref 6–20)
CO2: 23 mmol/L (ref 22–32)
Calcium: 9.1 mg/dL (ref 8.9–10.3)
Chloride: 107 mmol/L (ref 98–111)
Creatinine, Ser: 0.67 mg/dL (ref 0.44–1.00)
GFR, Estimated: >60 mL/min (ref >60)
Glucose, Bld: 121 mg/dL — ABNORMAL HIGH (ref 70–99)
Potassium: 3.2 mmol/L — ABNORMAL LOW (ref 3.5–5.1)
Sodium: 138 mmol/L (ref 135–145)

## 2022-03-12 LAB — RESP PANEL BY RT-PCR (FLU A&B, COVID) ARPGX2
Influenza A by PCR: NEGATIVE
Influenza B by PCR: NEGATIVE
SARS Coronavirus 2 by RT PCR: NEGATIVE

## 2022-03-12 LAB — CBC WITH DIFFERENTIAL/PLATELET
Abs Immature Granulocytes: 0.02 10*3/uL (ref 0.00–0.07)
Basophils Absolute: 0 10*3/uL (ref 0.0–0.1)
Basophils Relative: 0 %
Eosinophils Absolute: 1.1 10*3/uL — ABNORMAL HIGH (ref 0.0–0.5)
Eosinophils Relative: 12 %
HCT: 38.7 % (ref 36.0–46.0)
Hemoglobin: 12.4 g/dL (ref 12.0–15.0)
Immature Granulocytes: 0 %
Lymphocytes Relative: 41 %
Lymphs Abs: 3.7 10*3/uL (ref 0.7–4.0)
MCH: 26.3 pg (ref 26.0–34.0)
MCHC: 32 g/dL (ref 30.0–36.0)
MCV: 82.2 fL (ref 80.0–100.0)
Monocytes Absolute: 0.4 10*3/uL (ref 0.1–1.0)
Monocytes Relative: 5 %
Neutro Abs: 3.8 10*3/uL (ref 1.7–7.7)
Neutrophils Relative %: 42 %
Platelets: 265 10*3/uL (ref 150–400)
RBC: 4.71 MIL/uL (ref 3.87–5.11)
RDW: 14.9 % (ref 11.5–15.5)
Smear Review: NORMAL
WBC: 9 10*3/uL (ref 4.0–10.5)
nRBC: 0 % (ref 0.0–0.2)

## 2022-03-12 LAB — TROPONIN I (HIGH SENSITIVITY)
Troponin I (High Sensitivity): <2 ng/L (ref <18)
Troponin I (High Sensitivity): <2 ng/L (ref <18)

## 2022-03-12 LAB — D-DIMER, QUANTITATIVE: D-Dimer, Quant: 0.47 ug/mL-FEU (ref 0.00–0.50)

## 2022-03-12 MED ORDER — ALBUTEROL SULFATE HFA 108 (90 BASE) MCG/ACT IN AERS
2.0000 | INHALATION_SPRAY | RESPIRATORY_TRACT | Status: DC | PRN
  Administered 2022-03-12: 2 via RESPIRATORY_TRACT
  Filled 2022-03-12: qty 6.7

## 2022-03-12 MED ORDER — BUDESONIDE-FORMOTEROL FUMARATE 80-4.5 MCG/ACT IN AERO: 2.0000 | INHALATION_SPRAY | Freq: Every day | RESPIRATORY_TRACT | 0 refills | Status: AC

## 2022-03-12 MED ORDER — PREDNISONE 20 MG PO TABS
60.0000 mg | ORAL_TABLET | Freq: Once | ORAL | Status: AC
  Administered 2022-03-12: 60 mg via ORAL
  Filled 2022-03-12: qty 3

## 2022-03-12 MED ORDER — AEROCHAMBER PLUS FLO-VU MEDIUM MISC
1.0000 | Freq: Once | Status: AC
Start: 2022-03-12 — End: 2022-03-12
  Administered 2022-03-12: 1
  Filled 2022-03-12: qty 1

## 2022-03-12 MED ORDER — ALBUTEROL SULFATE (2.5 MG/3ML) 0.083% IN NEBU: 2.5000 mg | INHALATION_SOLUTION | RESPIRATORY_TRACT | 2 refills | Status: AC | PRN

## 2022-03-12 MED ORDER — IPRATROPIUM-ALBUTEROL 0.5-2.5 (3) MG/3ML IN SOLN
3.0000 mL | Freq: Once | RESPIRATORY_TRACT | Status: AC
  Administered 2022-03-12: 3 mL via RESPIRATORY_TRACT
  Filled 2022-03-12: qty 3

## 2022-03-12 MED ORDER — PREDNISONE 10 MG (21) PO TBPK: ORAL_TABLET | ORAL | 0 refills | Status: DC

## 2022-03-12 MED ORDER — ALBUTEROL SULFATE (2.5 MG/3ML) 0.083% IN NEBU: 2.5000 mg | INHALATION_SOLUTION | RESPIRATORY_TRACT | 2 refills | Status: DC | PRN

## 2022-03-12 NOTE — ED Triage Notes (Signed)
Pt has HX of asthma. Stated it got exacerbated last night but unknown from what. Started before she went to bed. She denies cough or pain. Pt does have headache. Pt has been hospitalized from asthma. Pt appears to have increased work of breathing. PA at bedside.

## 2022-03-12 NOTE — ED Notes (Signed)
DC instructions given verbally and in writing, understanding voiced, signature obtained.  Pt left in stable condition to POV. 

## 2022-03-12 NOTE — ED Provider Triage Note (Signed)
Emergency Medicine Provider Triage Evaluation Note  Megan Barnes , a 26 y.o. female  was evaluated in triage.  Pt complains of asthma exacerbation. Got bad last night. No cough. No chest pain. No leg swelling. No history of PE/DVT. Has required admission due to asthma. Never been intubated before. No fever, congestion. Seen 1 week ago. Finished prednisone course 6 days ago. Has used her inhalers and nebulizers all day without relief.  Review of Systems  Positive: Cough, wheezing Negative: fever  Physical Exam  There were no vitals taken for this visit. Gen:   Awake,  Resp:  Mildy tachypneic, speaking in short sentences MSK:   Moves extremities without difficulty  Other:  No leg swelling  Medical Decision Making  Medically screening exam initiated at 5:28 PM.  Appropriate orders placed.  Megan Barnes was informed that the remainder of the evaluation will be completed by another provider, this initial triage assessment does not replace that evaluation, and the importance of remaining in the ED until their evaluation is complete.  Given steroids and DuoNeb while awaiting a room   Jackelyn Hoehn, PA-C 03/12/22 1739

## 2022-03-12 NOTE — ED Provider Notes (Signed)
[facility] Provider Note   Event Date/Time   First MD Initiated Contact with Patient 03/12/22 1812     (approximate) History  Shortness of Breath  HPI Megan Barnes is a 26 y.o. female with stated past medical history of asthma on albuterol inhalers and nebulizers at home who presents complaining of worsening shortness of breath over the last 24 hours.  Patient states that she has been using her at home inhalers as well as nebulizers with little improvement over the past 24 hours and therefore came in POV to the emergency department.  Patient is concerned as she does have seasonal allergies however this is the second time in 10 days that she has needed treatment for an asthma exacerbation.  Patient states that she does take over-the-counter allergy medication but only takes it as needed and has not been taking it recently.  Patient states that she feels much improved and her breathing since receiving a breathing treatment and prednisone in triage. ROS: Patient currently denies any vision changes, tinnitus, difficulty speaking, facial droop, sore throat, chest pain, abdominal pain, nausea/vomiting/diarrhea, dysuria, or weakness/numbness/paresthesias in any extremity   Physical Exam  Triage Vital Signs: ED Triage Vitals  Enc Vitals Group     BP 03/12/22 1731 (!) 161/85     Pulse Rate 03/12/22 1731 (!) 135     Resp 03/12/22 1731 16     Temp 03/12/22 1731 98 F (36.7 C)     Temp Source 03/12/22 1731 Oral     SpO2 03/12/22 1731 94 %     Weight 03/12/22 1732 210 lb (95.3 kg)     Height 03/12/22 1732 5\' 8"  (1.727 m)     Head Circumference --      Peak Flow --      Pain Score 03/12/22 1732 0     Pain Loc --      Pain Edu? --      Excl. in GC? --    Most recent vital signs: Vitals:   03/12/22 1900 03/12/22 2000  BP: 133/78 132/79  Pulse: 94   Resp:  16  Temp: 98.2 F (36.8 C) 98.4 F (36.9 C)  SpO2: 97% 98%   General: Awake, oriented x4. CV:  Good  peripheral perfusion.  Resp:  Normal effort.  Inspiratory and expiratory wheezing over bilateral lung fields Abd:  No distention.  Other:  Middle-aged overweight Caucasian female sitting upright in bed in no acute respiratory distress ED Results / Procedures / Treatments  Labs (all labs ordered are listed, but only abnormal results are displayed) Labs Reviewed  BASIC METABOLIC PANEL - Abnormal; Notable for the following components:      Result Value   Potassium 3.2 (*)    Glucose, Bld 121 (*)    All other components within normal limits  CBC WITH DIFFERENTIAL/PLATELET - Abnormal; Notable for the following components:   Eosinophils Absolute 1.1 (*)    All other components within normal limits  RESP PANEL BY RT-PCR (FLU A&B, COVID) ARPGX2  D-DIMER, QUANTITATIVE  TROPONIN I (HIGH SENSITIVITY)  TROPONIN I (HIGH SENSITIVITY)   EKG ED ECG REPORT I, 13/11/23, the attending physician, personally viewed and interpreted this ECG. Date: 03/12/2022 EKG Time: 1750 Rate: 116 Rhythm: Tachycardic sinus rhythm QRS Axis: normal Intervals: normal ST/T Wave abnormalities: normal Narrative Interpretation: Tachycardic sinus rhythm.  No evidence of acute ischemia RADIOLOGY ED MD interpretation: 2 view chest x-ray interpreted by me shows no evidence of acute abnormalities  including no pneumonia, pneumothorax, or widened mediastinum -Agree with radiology assessment Official radiology report(s): DG Chest 2 View  Result Date: 03/12/2022 CLINICAL DATA:  Shortness of breath. EXAM: CHEST - 2 VIEW COMPARISON:  12/31/2021 FINDINGS: Heart size and mediastinal contours are unremarkable. No pleural effusion or edema. No airspace opacities identified. IMPRESSION: No active cardiopulmonary disease. Electronically Signed   By: Signa Kell M.D.   On: 03/12/2022 18:00   PROCEDURES: Critical Care performed: No .1-3 Lead EKG Interpretation  Performed by: Merwyn Katos, MD Authorized by: Merwyn Katos, MD     Interpretation: normal     ECG rate:  93   ECG rate assessment: normal     Rhythm: sinus rhythm     Ectopy: none     Conduction: normal    MEDICATIONS ORDERED IN ED: Medications  albuterol (VENTOLIN HFA) 108 (90 Base) MCG/ACT inhaler 2 puff (2 puffs Inhalation Given 03/12/22 2007)  predniSONE (DELTASONE) tablet 60 mg (60 mg Oral Given 03/12/22 1751)  ipratropium-albuterol (DUONEB) 0.5-2.5 (3) MG/3ML nebulizer solution 3 mL (3 mLs Nebulization Given 03/12/22 1744)  AeroChamber Plus Flo-Vu Medium MISC 1 each (1 each Other Given 03/12/22 2008)   IMPRESSION / MDM / ASSESSMENT AND PLAN / ED COURSE  I reviewed the triage vital signs and the nursing notes.                             The patient is on the cardiac monitor to evaluate for evidence of arrhythmia and/or significant heart rate changes. Patient's presentation is most consistent with acute presentation with potential threat to life or bodily function. Presentation most consistent with acute asthma exacerbation.  Presentation less concerning for pneumonia, heart failure, foreign body airway obstruction, pulmonary embolism, tamponade, atypical ACS  Reassuring factors: No AMS, silent respirations, belly-breathing, or other sign of impending ventilatory failure. Never intubated or admitted to the hospital for asthma exacerbation.  Workup Defer labs and imaging given clinically in exacerbation of known asthma with similar exacerbation presentations per patient.  Reassessment: Patient respiratory status improved with breathing treatment.  Disposition: Discharge home with return precautions. Advised to follow up with primary care physician within next 24-48 hours.   FINAL CLINICAL IMPRESSION(S) / ED DIAGNOSES   Final diagnoses:  Mild intermittent asthma with exacerbation  SOB (shortness of breath)   Rx / DC Orders   ED Discharge Orders          Ordered    albuterol (PROVENTIL) (2.5 MG/3ML) 0.083% nebulizer  solution  Every 4 hours PRN,   Status:  Discontinued        03/12/22 1829    predniSONE (STERAPRED UNI-PAK 21 TAB) 10 MG (21) TBPK tablet  Status:  Discontinued        03/12/22 1829    budesonide-formoterol (SYMBICORT) 80-4.5 MCG/ACT inhaler  Daily        03/12/22 1833    albuterol (PROVENTIL) (2.5 MG/3ML) 0.083% nebulizer solution  Every 4 hours PRN        03/12/22 1833    predniSONE (STERAPRED UNI-PAK 21 TAB) 10 MG (21) TBPK tablet        03/12/22 1833           Note:  This document was prepared using Dragon voice recognition software and may include unintentional dictation errors.   Merwyn Katos, MD 03/12/22 2021

## 2023-02-01 IMAGING — DX DG CHEST 1V PORT
1 series · 1 of 1 positions shown · non-contrast
Comparison: None.

CLINICAL DATA: Asthma exacerbation

EXAM:
PORTABLE CHEST 1 VIEW

[chest ap]
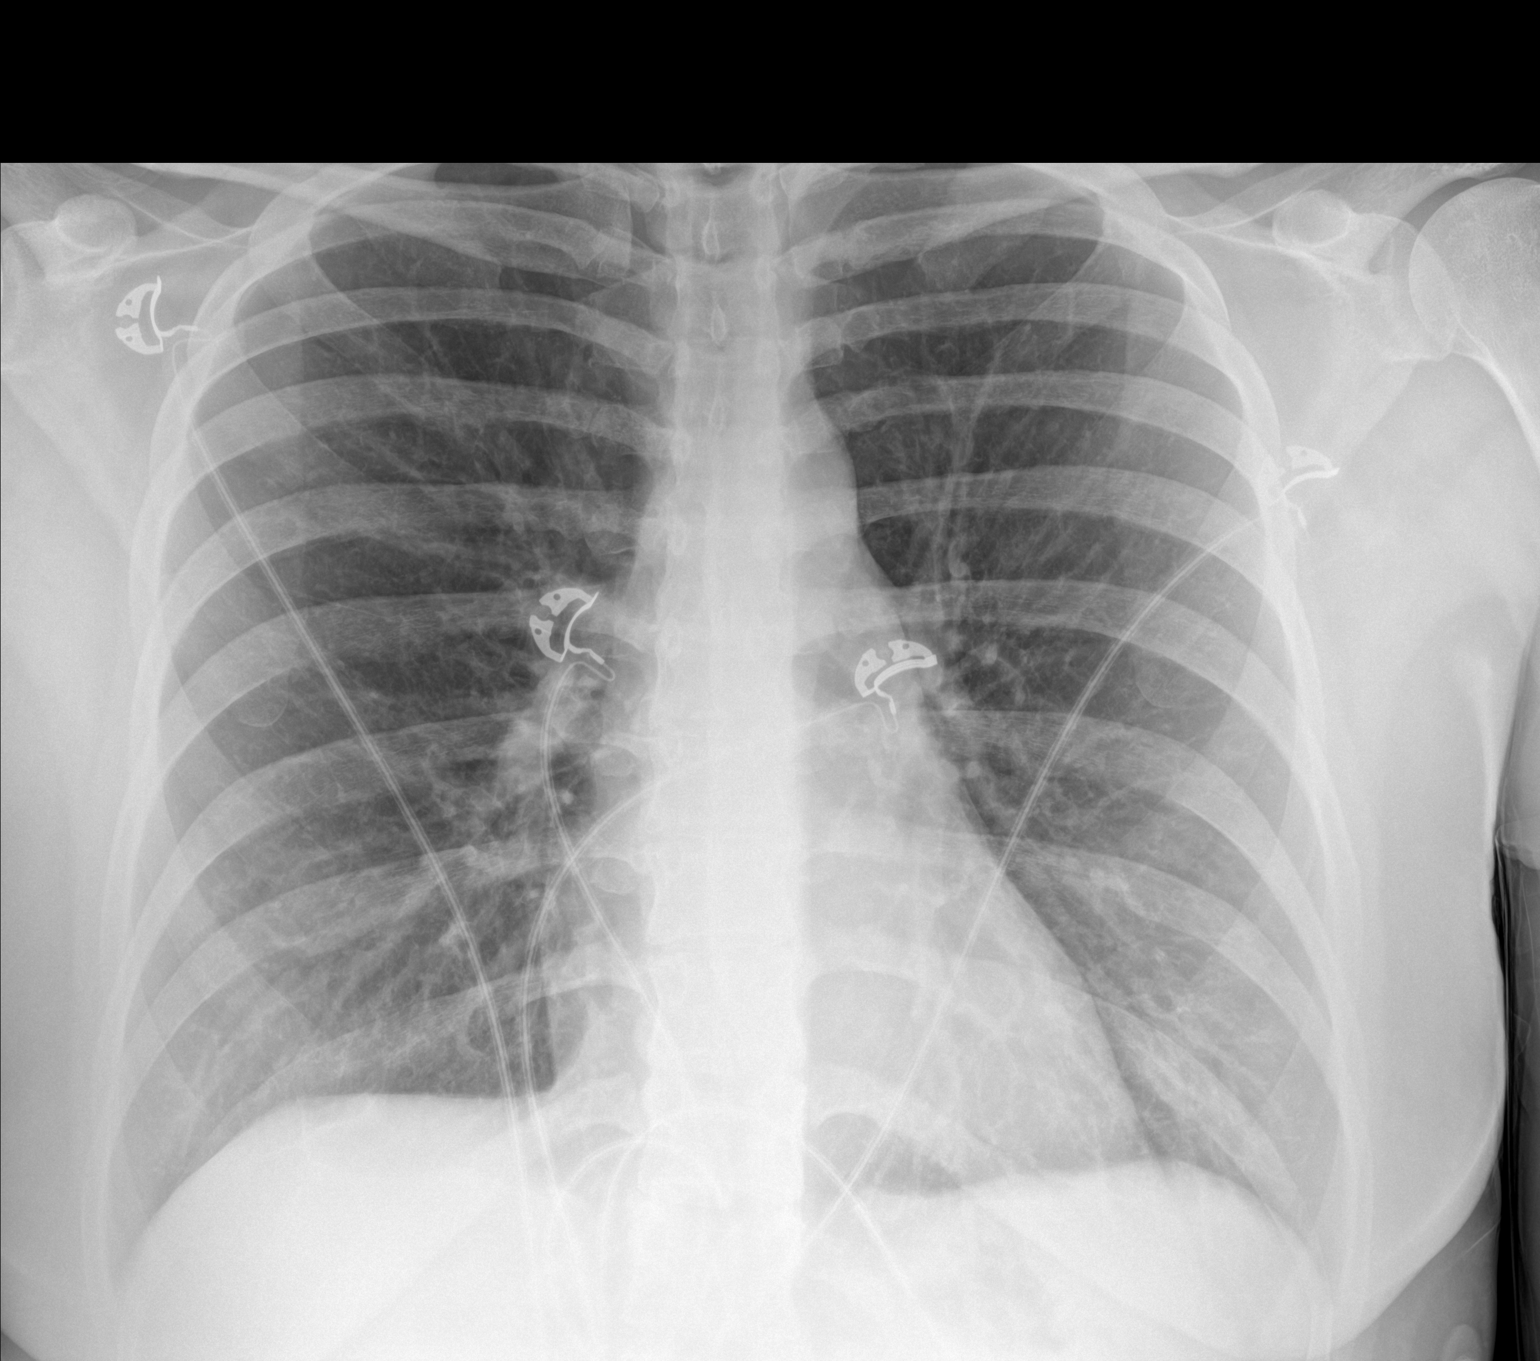

[1 of 1 positions shown; findings below may reference images not displayed]

FINDINGS: The heart size and mediastinal contours are within normal limits.
Both lungs are clear. The visualized skeletal structures are
unremarkable.
IMPRESSION: No active disease.

## 2023-06-29 ENCOUNTER — Emergency Department (HOSPITAL_BASED_OUTPATIENT_CLINIC_OR_DEPARTMENT_OTHER)
Admission: EM | Admit: 2023-06-29 | Discharge: 2023-06-29 | Disposition: A | Discharge status: Home / Self Care | Payer: 59 | Source: Home / Self Care | Attending: Emergency Medicine | Admitting: Emergency Medicine

## 2023-06-29 ENCOUNTER — Encounter (HOSPITAL_BASED_OUTPATIENT_CLINIC_OR_DEPARTMENT_OTHER): Payer: Self-pay | Admitting: Emergency Medicine

## 2023-06-29 ENCOUNTER — Other Ambulatory Visit: Payer: Self-pay

## 2023-06-29 DIAGNOSIS — J4541 Moderate persistent asthma with (acute) exacerbation: Secondary | ICD-10-CM | POA: Insufficient documentation

## 2023-06-29 DIAGNOSIS — Z7951 Long term (current) use of inhaled steroids: Secondary | ICD-10-CM | POA: Insufficient documentation

## 2023-06-29 DIAGNOSIS — Z7952 Long term (current) use of systemic steroids: Secondary | ICD-10-CM | POA: Insufficient documentation

## 2023-06-29 DIAGNOSIS — R1012 Left upper quadrant pain: Secondary | ICD-10-CM | POA: Diagnosis not present

## 2023-06-29 DIAGNOSIS — A419 Sepsis, unspecified organism: Secondary | ICD-10-CM | POA: Diagnosis not present

## 2023-06-29 MED ORDER — ALBUTEROL SULFATE (2.5 MG/3ML) 0.083% IN NEBU
2.5000 mg | INHALATION_SOLUTION | Freq: Once | RESPIRATORY_TRACT | Status: AC
  Administered 2023-06-29: 2.5 mg via RESPIRATORY_TRACT
  Filled 2023-06-29: qty 3

## 2023-06-29 MED ORDER — IPRATROPIUM-ALBUTEROL 0.5-2.5 (3) MG/3ML IN SOLN
3.0000 mL | Freq: Once | RESPIRATORY_TRACT | Status: AC
  Administered 2023-06-29: 3 mL via RESPIRATORY_TRACT
  Filled 2023-06-29: qty 3

## 2023-06-29 MED ORDER — PREDNISONE 10 MG PO TABS: 20.0000 mg | ORAL_TABLET | Freq: Two times a day (BID) | ORAL | 0 refills | Status: DC

## 2023-06-29 MED ORDER — ALBUTEROL SULFATE (2.5 MG/3ML) 0.083% IN NEBU
2.5000 mg | INHALATION_SOLUTION | Freq: Once | RESPIRATORY_TRACT | Status: AC
Start: 2023-06-29 — End: 2023-06-29
  Administered 2023-06-29: 2.5 mg via RESPIRATORY_TRACT
  Filled 2023-06-29: qty 3

## 2023-06-29 MED ORDER — PREDNISONE 20 MG PO TABS
40.0000 mg | ORAL_TABLET | Freq: Once | ORAL | Status: AC
  Administered 2023-06-29: 40 mg via ORAL
  Filled 2023-06-29: qty 2

## 2023-06-29 MED ORDER — IPRATROPIUM-ALBUTEROL 0.5-2.5 (3) MG/3ML IN SOLN
3.0000 mL | Freq: Once | RESPIRATORY_TRACT | Status: AC
Start: 2023-06-29 — End: 2023-06-29
  Administered 2023-06-29: 3 mL via RESPIRATORY_TRACT
  Filled 2023-06-29: qty 3

## 2023-06-29 NOTE — ED Notes (Signed)
 Patient reports slight improvement in symptoms

## 2023-06-29 NOTE — Discharge Instructions (Signed)
 Begin taking prednisone as prescribed.  Use your nebulizer, 1 treatment every 4 hours as needed for wheezing.  Follow-up with primary doctor if not improving in the next few days, and return to the ER if symptoms significantly worsen or change.

## 2023-06-29 NOTE — ED Triage Notes (Signed)
 Pt states asthma attack, started today but worse in last 30 mins. Tried inhaler and treatments at home with no relief.

## 2023-06-29 NOTE — ED Provider Notes (Signed)
 [place] [facility] Provider Note   CSN: [id number] Arrival date & time: 06/29/23  0417     History  Chief Complaint  Patient presents with   Shortness of Breath    Megan Barnes is a 28 y.o. female.  Patient is a 28 year old female with past medical history of asthma.  Patient presenting today with complaints of shortness of breath and wheezing.  This started earlier today, then worsened in the night.  She denies any chest pain, fevers, chills, or productive cough.  No aggravating or alleviating factors.  The history is provided by the patient.       Home Medications Prior to Admission medications   Medication Sig Start Date End Date Taking? Authorizing Provider  acyclovir (ZOVIRAX) 800 MG tablet Take 1 tablet (800 mg total) by mouth 2 (two) times daily. 02/11/21   Wendi Snipes, FNP  albuterol (PROVENTIL) (2.5 MG/3ML) 0.083% nebulizer solution Take 3 mLs (2.5 mg total) by nebulization every 4 (four) hours as needed for wheezing or shortness of breath. 03/12/22 03/12/23  Merwyn Katos, MD  albuterol (VENTOLIN HFA) 108 (90 Base) MCG/ACT inhaler Inhale 2 puffs into the lungs every 6 (six) hours as needed for wheezing or shortness of breath. 12/03/21   Varney Daily, PA  azithromycin (ZITHROMAX Z-PAK) 250 MG tablet 2 pills today then 1 pill a day for 4 days 12/31/21   Sherrie Mustache Roselyn Bering, PA-C  budesonide-formoterol [facility]) 80-4.5 MCG/ACT inhaler Inhale 2 puffs into the lungs daily. 03/12/22   Merwyn Katos, MD  dicyclomine (BENTYL) 20 MG tablet Take 1 tablet (20 mg total) by mouth 3 (three) times daily as needed (abdominal pain). Patient not taking: No sig reported 06/10/18   Phineas Semen, MD  fexofenadine-pseudoephedrine (ALLEGRA-D) 60-120 MG 12 hr tablet Take 1 tablet by mouth 2 (two) times daily. 09/13/19   Joni Reining, PA-C  fluticasone (FLONASE) 50 MCG/ACT nasal spray Place 2 sprays into both nostrils daily. 09/08/15 09/07/16   Tommi Rumps, PA-C  fluticasone-salmeterol (WIXELA INHUB) 250-50 MCG/ACT AEPB Inhale 1 puff into the lungs in the morning and at bedtime. 12/03/21 01/02/22  Varney Daily, PA  ibuprofen (ADVIL) 600 MG tablet Take 1 tablet (600 mg total) by mouth every 8 (eight) hours as needed. 02/15/19   Joni Reining, PA-C  ipratropium-albuterol (DUONEB) 0.5-2.5 (3) MG/3ML SOLN Take 3 mLs by nebulization every 4 (four) hours as needed. 12/31/21   Fisher, Roselyn Bering, PA-C  montelukast (SINGULAIR) 10 MG tablet Take 10 mg by mouth at bedtime.    [provider]  predniSONE (STERAPRED UNI-PAK 21 TAB) 10 MG (21) TBPK tablet As directed on packaging 03/12/22   Merwyn Katos, MD      Allergies    Penicillins    Review of Systems   Review of Systems  All other systems reviewed and are negative.   Physical Exam Updated Vital Signs Ht 5\' 8"  (1.727 m)   Wt 95.3 kg   LMP 06/15/2023 (Approximate)   BMI 31.93 kg/m  Physical Exam Vitals and nursing note reviewed.  Constitutional:      General: She is not in acute distress.    Appearance: She is well-developed. She is not diaphoretic.  HENT:     Head: Normocephalic and atraumatic.  Cardiovascular:     Rate and Rhythm: Normal rate and regular rhythm.     Heart sounds: No murmur heard.    No friction rub. No gallop.  Pulmonary:  Effort: Pulmonary effort is normal. No respiratory distress.     Breath sounds: Examination of the right-middle field reveals rhonchi. Examination of the left-middle field reveals rhonchi. Rhonchi present. No wheezing.  Abdominal:     General: Bowel sounds are normal. There is no distension.     Palpations: Abdomen is soft.     Tenderness: There is no abdominal tenderness.  Musculoskeletal:        General: Normal range of motion.     Cervical back: Normal range of motion and neck supple.  Skin:    General: Skin is warm and dry.  Neurological:     General: No focal deficit present.     Mental Status: She is  alert and oriented to person, place, and time.     ED Results / Procedures / Treatments   Labs (all labs ordered are listed, but only abnormal results are displayed) Labs Reviewed - No data to display  EKG EKG Interpretation Date/Time:  Thursday June 29 2023 04:26:32 EST Ventricular Rate:  105 PR Interval:  144 QRS Duration:  122 QT Interval:  366 QTC Calculation: 484 R Axis:   49  Text Interpretation: Sinus tachycardia Baseline wander in lead(s) II III aVF Confirmed by Geoffery Lyons ([id number]) on 06/29/2023 4:29:01 AM  Radiology No results found.  Procedures Procedures    Medications Ordered in ED Medications  albuterol (PROVENTIL) (2.5 MG/3ML) 0.083% nebulizer solution 2.5 mg (has no administration in time range)  ipratropium-albuterol (DUONEB) 0.5-2.5 (3) MG/3ML nebulizer solution 3 mL (has no administration in time range)  predniSONE (DELTASONE) tablet 40 mg (has no administration in time range)    ED Course/ Medical Decision Making/ A&P  Patient is a 28 year old female with history of asthma presenting with wheezing and shortness of breath.  Patient arrives here with stable vital signs and is afebrile.  Oxygen saturations are in the mid 90s on room air.  Patient has received 2 DuoNebs along with 40 mg of prednisone and is feeling considerably improved.  She will be discharged with prednisone and continued use of her inhaler/nebulizer.  Final Clinical Impression(s) / ED Diagnoses Final diagnoses:  None    Rx / DC Orders ED Discharge Orders     None         Geoffery Lyons, MD 06/29/23 [phone]

## 2023-07-02 ENCOUNTER — Inpatient Hospital Stay (HOSPITAL_COMMUNITY)

## 2023-07-02 ENCOUNTER — Encounter (HOSPITAL_BASED_OUTPATIENT_CLINIC_OR_DEPARTMENT_OTHER): Payer: Self-pay | Admitting: Emergency Medicine

## 2023-07-02 ENCOUNTER — Other Ambulatory Visit: Payer: Self-pay

## 2023-07-02 ENCOUNTER — Inpatient Hospital Stay (HOSPITAL_BASED_OUTPATIENT_CLINIC_OR_DEPARTMENT_OTHER)
Admission: EM | Admit: 2023-07-02 | Discharge: 2023-07-04 | DRG: 871 | Disposition: A | Discharge status: Home / Self Care | Attending: Internal Medicine | Admitting: Internal Medicine

## 2023-07-02 ENCOUNTER — Emergency Department (HOSPITAL_BASED_OUTPATIENT_CLINIC_OR_DEPARTMENT_OTHER)

## 2023-07-02 DIAGNOSIS — R7989 Other specified abnormal findings of blood chemistry: Secondary | ICD-10-CM | POA: Diagnosis present

## 2023-07-02 DIAGNOSIS — J45909 Unspecified asthma, uncomplicated: Secondary | ICD-10-CM | POA: Diagnosis present

## 2023-07-02 DIAGNOSIS — Z833 Family history of diabetes mellitus: Secondary | ICD-10-CM

## 2023-07-02 DIAGNOSIS — R079 Chest pain, unspecified: Secondary | ICD-10-CM

## 2023-07-02 DIAGNOSIS — Z880 Allergy status to penicillin: Secondary | ICD-10-CM | POA: Diagnosis not present

## 2023-07-02 DIAGNOSIS — E66811 Obesity, class 1: Secondary | ICD-10-CM | POA: Diagnosis present

## 2023-07-02 DIAGNOSIS — Z1152 Encounter for screening for COVID-19: Secondary | ICD-10-CM

## 2023-07-02 DIAGNOSIS — E860 Dehydration: Secondary | ICD-10-CM | POA: Diagnosis present

## 2023-07-02 DIAGNOSIS — R1012 Left upper quadrant pain: Secondary | ICD-10-CM | POA: Diagnosis present

## 2023-07-02 DIAGNOSIS — R6520 Severe sepsis without septic shock: Secondary | ICD-10-CM | POA: Diagnosis present

## 2023-07-02 DIAGNOSIS — F1210 Cannabis abuse, uncomplicated: Secondary | ICD-10-CM | POA: Diagnosis present

## 2023-07-02 DIAGNOSIS — E871 Hypo-osmolality and hyponatremia: Secondary | ICD-10-CM | POA: Diagnosis present

## 2023-07-02 DIAGNOSIS — R17 Unspecified jaundice: Secondary | ICD-10-CM

## 2023-07-02 DIAGNOSIS — Z79899 Other long term (current) drug therapy: Secondary | ICD-10-CM

## 2023-07-02 DIAGNOSIS — F32A Depression, unspecified: Secondary | ICD-10-CM | POA: Diagnosis present

## 2023-07-02 DIAGNOSIS — Z6832 Body mass index (BMI) 32.0-32.9, adult: Secondary | ICD-10-CM | POA: Diagnosis not present

## 2023-07-02 DIAGNOSIS — Z7952 Long term (current) use of systemic steroids: Secondary | ICD-10-CM

## 2023-07-02 DIAGNOSIS — A419 Sepsis, unspecified organism: Secondary | ICD-10-CM | POA: Diagnosis present

## 2023-07-02 DIAGNOSIS — E872 Acidosis, unspecified: Secondary | ICD-10-CM | POA: Diagnosis present

## 2023-07-02 DIAGNOSIS — R55 Syncope and collapse: Secondary | ICD-10-CM | POA: Diagnosis present

## 2023-07-02 DIAGNOSIS — G43909 Migraine, unspecified, not intractable, without status migrainosus: Secondary | ICD-10-CM | POA: Diagnosis present

## 2023-07-02 DIAGNOSIS — J189 Pneumonia, unspecified organism: Principal | ICD-10-CM | POA: Diagnosis present

## 2023-07-02 DIAGNOSIS — Z7951 Long term (current) use of inhaled steroids: Secondary | ICD-10-CM | POA: Diagnosis not present

## 2023-07-02 LAB — COMPREHENSIVE METABOLIC PANEL
ALT: 44 U/L (ref 0–44)
AST: 27 U/L (ref 15–41)
Albumin: 3.3 g/dL — ABNORMAL LOW (ref 3.5–5.0)
Alkaline Phosphatase: 72 U/L (ref 38–126)
Anion gap: 12 (ref 5–15)
BUN: 14 mg/dL (ref 6–20)
CO2: 17 mmol/L — ABNORMAL LOW (ref 22–32)
Calcium: 8.8 mg/dL — ABNORMAL LOW (ref 8.9–10.3)
Chloride: 101 mmol/L (ref 98–111)
Creatinine, Ser: 0.82 mg/dL (ref 0.44–1.00)
GFR, Estimated: >60 mL/min (ref >60)
Glucose, Bld: 111 mg/dL — ABNORMAL HIGH (ref 70–99)
Potassium: 3.5 mmol/L (ref 3.5–5.1)
Sodium: 130 mmol/L — ABNORMAL LOW (ref 135–145)
Total Bilirubin: 2.3 mg/dL — ABNORMAL HIGH (ref 0.0–1.2)
Total Protein: 6.3 g/dL — ABNORMAL LOW (ref 6.5–8.1)

## 2023-07-02 LAB — CBC WITH DIFFERENTIAL/PLATELET
Abs Immature Granulocytes: 0.11 10*3/uL — ABNORMAL HIGH (ref 0.00–0.07)
Basophils Absolute: 0 10*3/uL (ref 0.0–0.1)
Basophils Relative: 0 %
Eosinophils Absolute: 0 10*3/uL (ref 0.0–0.5)
Eosinophils Relative: 0 %
HCT: 38.4 % (ref 36.0–46.0)
Hemoglobin: 12.5 g/dL (ref 12.0–15.0)
Immature Granulocytes: 1 %
Lymphocytes Relative: 7 %
Lymphs Abs: 0.9 10*3/uL (ref 0.7–4.0)
MCH: 27.1 pg (ref 26.0–34.0)
MCHC: 32.6 g/dL (ref 30.0–36.0)
MCV: 83.1 fL (ref 80.0–100.0)
Monocytes Absolute: 0.6 10*3/uL (ref 0.1–1.0)
Monocytes Relative: 4 %
Neutro Abs: 11.3 10*3/uL — ABNORMAL HIGH (ref 1.7–7.7)
Neutrophils Relative %: 88 %
Platelets: 261 10*3/uL (ref 150–400)
RBC: 4.62 MIL/uL (ref 3.87–5.11)
RDW: 14.6 % (ref 11.5–15.5)
WBC: 12.9 10*3/uL — ABNORMAL HIGH (ref 4.0–10.5)
nRBC: 0 % (ref 0.0–0.2)

## 2023-07-02 LAB — URINALYSIS, ROUTINE W REFLEX MICROSCOPIC
Bilirubin Urine: NEGATIVE
Glucose, UA: NEGATIVE mg/dL
Hgb urine dipstick: NEGATIVE
Ketones, ur: NEGATIVE mg/dL
Leukocytes,Ua: NEGATIVE
Nitrite: NEGATIVE
Protein, ur: 30 mg/dL — AB
Specific Gravity, Urine: 1.015 (ref 1.005–1.030)
pH: 6.5 (ref 5.0–8.0)

## 2023-07-02 LAB — URINALYSIS, MICROSCOPIC (REFLEX)

## 2023-07-02 LAB — GLUCOSE, CAPILLARY: Glucose-Capillary: 95 mg/dL (ref 70–99)

## 2023-07-02 LAB — RESP PANEL BY RT-PCR (RSV, FLU A&B, COVID)  RVPGX2
Influenza A by PCR: NEGATIVE
Influenza B by PCR: NEGATIVE
Resp Syncytial Virus by PCR: NEGATIVE
SARS Coronavirus 2 by RT PCR: NEGATIVE

## 2023-07-02 LAB — LIPASE, BLOOD: Lipase: 22 U/L (ref 11–51)

## 2023-07-02 LAB — BLOOD GAS, VENOUS
Acid-base deficit: 3.4 mmol/L — ABNORMAL HIGH (ref 0.0–2.0)
Bicarbonate: 20.1 mmol/L (ref 20.0–28.0)
O2 Saturation: 78.2 %
Patient temperature: 36.9
pCO2, Ven: 31 mmHg — ABNORMAL LOW (ref 44–60)
pH, Ven: 7.42 (ref 7.25–7.43)
pO2, Ven: 45 mmHg (ref 32–45)

## 2023-07-02 LAB — TROPONIN I (HIGH SENSITIVITY): Troponin I (High Sensitivity): 3 ng/L (ref <18)

## 2023-07-02 LAB — LACTIC ACID, PLASMA
Lactic Acid, Venous: 2.1 mmol/L (ref 0.5–1.9)
Lactic Acid, Venous: 2.3 mmol/L (ref 0.5–1.9)
Lactic Acid, Venous: 2.5 mmol/L (ref 0.5–1.9)

## 2023-07-02 LAB — PREGNANCY, URINE: Preg Test, Ur: NEGATIVE

## 2023-07-02 MED ORDER — SODIUM CHLORIDE 0.9 % IV BOLUS
1000.0000 mL | Freq: Once | INTRAVENOUS | Status: AC
  Administered 2023-07-02: 1000 mL via INTRAVENOUS

## 2023-07-02 MED ORDER — ACETAMINOPHEN 325 MG PO TABS
650.0000 mg | ORAL_TABLET | Freq: Once | ORAL | Status: AC
  Administered 2023-07-02: 650 mg via ORAL
  Filled 2023-07-02: qty 2

## 2023-07-02 MED ORDER — MOMETASONE FURO-FORMOTEROL FUM 100-5 MCG/ACT IN AERO
2.0000 | INHALATION_SPRAY | Freq: Two times a day (BID) | RESPIRATORY_TRACT | Status: DC
  Administered 2023-07-03 – 2023-07-04 (×4): 2 via RESPIRATORY_TRACT
  Filled 2023-07-02: qty 8.8

## 2023-07-02 MED ORDER — ACETAMINOPHEN 650 MG RE SUPP: 650.0000 mg | Freq: Four times a day (QID) | RECTAL | Status: DC | PRN

## 2023-07-02 MED ORDER — SODIUM CHLORIDE 0.9 % IV SOLN: INTRAVENOUS | Status: AC

## 2023-07-02 MED ORDER — SODIUM CHLORIDE 0.9 % IV SOLN
1.0000 g | INTRAVENOUS | Status: DC
  Administered 2023-07-02 – 2023-07-04 (×3): 1 g via INTRAVENOUS
  Filled 2023-07-02 (×3): qty 10

## 2023-07-02 MED ORDER — ACETAMINOPHEN 325 MG PO TABS
650.0000 mg | ORAL_TABLET | Freq: Four times a day (QID) | ORAL | Status: DC | PRN
  Administered 2023-07-02 – 2023-07-04 (×3): 650 mg via ORAL
  Filled 2023-07-02 (×3): qty 2

## 2023-07-02 MED ORDER — LEVALBUTEROL HCL 0.63 MG/3ML IN NEBU: 0.6300 mg | INHALATION_SOLUTION | Freq: Four times a day (QID) | RESPIRATORY_TRACT | Status: DC | PRN

## 2023-07-02 MED ORDER — GUAIFENESIN-DM 100-10 MG/5ML PO SYRP
5.0000 mL | ORAL_SOLUTION | ORAL | Status: DC | PRN
  Administered 2023-07-02 – 2023-07-04 (×4): 5 mL via ORAL
  Filled 2023-07-02 (×4): qty 10

## 2023-07-02 MED ORDER — SODIUM CHLORIDE 0.9 % IV SOLN
500.0000 mg | INTRAVENOUS | Status: DC
  Administered 2023-07-02 – 2023-07-03 (×2): 500 mg via INTRAVENOUS
  Filled 2023-07-02 (×3): qty 5

## 2023-07-02 MED ORDER — KETOROLAC TROMETHAMINE 15 MG/ML IJ SOLN
15.0000 mg | Freq: Once | INTRAMUSCULAR | Status: AC
  Administered 2023-07-02: 15 mg via INTRAVENOUS
  Filled 2023-07-02: qty 1

## 2023-07-02 NOTE — ED Notes (Signed)
 Attempted IV by [place]. Blood obtained and sent to lab. IV unsuccessful. Will assess need for IV on lab results and orders. Re-stick per EDP discretion.

## 2023-07-02 NOTE — ED Notes (Signed)
 Called [facility] for transport.

## 2023-07-02 NOTE — ED Triage Notes (Signed)
 Syncope today per cousin , pt reports LUQ pain radiates to flank and mid back .flu like symptoms 2 days ago . Denies urinary symptoms

## 2023-07-02 NOTE — ED Notes (Signed)
 Pt. Leaves with [facility] at this time. Her skin is normal, warm and dry and a frequent cough is noted. She is in no distress.

## 2023-07-02 NOTE — ED Notes (Signed)
 Pt tolerating lemon lime soda and graham crackers

## 2023-07-02 NOTE — ED Notes (Signed)
 I have just given report to the [facility] nurse. I have attempted three times to phone [address] [place] at [facility] with no answer.

## 2023-07-02 NOTE — ED Provider Notes (Cosign Needed Addendum)
 [place] [facility] Provider Note   CSN: [id number] Arrival date & time: 07/02/23  1021     History  Chief Complaint  Patient presents with   Abdominal Pain    Megan Barnes is a 28 y.o. female.   Abdominal Pain Associated symptoms: shortness of breath   Patient is a 28 year old female presents the ED today complaining of a syncope this morning accompanied with hemoptysis and left upper quadrant abdominal pain radiating to left flank. Previous medical history of asthma, marijuana use, HSV 1.  Unsure how long she was down for and does not believe to have hit her head.  Unsure if she had complete loss of consciousness.  States that she has had to use her albuterol inhaler multiple times in the last few days.  Seen by urgent care on 06/29/2023 and provided prednisone and DuoNebs with relief.   Reports shortness of breath on exertion. Denies fever, visual disturbances, headache, chest pain, nausea, vomiting, diarrhea, dysuria, lower extremity swelling     Home Medications Prior to Admission medications   Medication Sig Start Date End Date Taking? Authorizing Provider  acyclovir (ZOVIRAX) 800 MG tablet Take 1 tablet (800 mg total) by mouth 2 (two) times daily. 02/11/21   Wendi Snipes, FNP  albuterol (PROVENTIL) (2.5 MG/3ML) 0.083% nebulizer solution Take 3 mLs (2.5 mg total) by nebulization every 4 (four) hours as needed for wheezing or shortness of breath. 03/12/22 03/12/23  Merwyn Katos, MD  albuterol (VENTOLIN HFA) 108 (90 Base) MCG/ACT inhaler Inhale 2 puffs into the lungs every 6 (six) hours as needed for wheezing or shortness of breath. 12/03/21   Varney Daily, PA  azithromycin (ZITHROMAX Z-PAK) 250 MG tablet 2 pills today then 1 pill a day for 4 days 12/31/21   Sherrie Mustache Roselyn Bering, PA-C  budesonide-formoterol [facility]) 80-4.5 MCG/ACT inhaler Inhale 2 puffs into the lungs daily. 03/12/22   Merwyn Katos, MD  dicyclomine (BENTYL) 20  MG tablet Take 1 tablet (20 mg total) by mouth 3 (three) times daily as needed (abdominal pain). Patient not taking: No sig reported 06/10/18   Phineas Semen, MD  fexofenadine-pseudoephedrine (ALLEGRA-D) 60-120 MG 12 hr tablet Take 1 tablet by mouth 2 (two) times daily. 09/13/19   Joni Reining, PA-C  fluticasone (FLONASE) 50 MCG/ACT nasal spray Place 2 sprays into both nostrils daily. 09/08/15 09/07/16  Tommi Rumps, PA-C  fluticasone-salmeterol (WIXELA INHUB) 250-50 MCG/ACT AEPB Inhale 1 puff into the lungs in the morning and at bedtime. 12/03/21 01/02/22  Varney Daily, PA  ibuprofen (ADVIL) 600 MG tablet Take 1 tablet (600 mg total) by mouth every 8 (eight) hours as needed. 02/15/19   Joni Reining, PA-C  ipratropium-albuterol (DUONEB) 0.5-2.5 (3) MG/3ML SOLN Take 3 mLs by nebulization every 4 (four) hours as needed. 12/31/21   Fisher, Roselyn Bering, PA-C  montelukast (SINGULAIR) 10 MG tablet Take 10 mg by mouth at bedtime.    [provider]  predniSONE (DELTASONE) 10 MG tablet Take 2 tablets (20 mg total) by mouth 2 (two) times daily. 06/29/23   Geoffery Lyons, MD      Allergies    Penicillins    Review of Systems   Review of Systems  Respiratory:  Positive for shortness of breath.   Gastrointestinal:  Positive for abdominal pain.  All other systems reviewed and are negative.   Physical Exam Updated Vital Signs BP (!) 96/48   Pulse (!) 119   Temp 98.6  F (37 C)   Resp (!) 22   Wt 95.5 kg   LMP 06/15/2023 (Approximate)   SpO2 92%   BMI 32.01 kg/m  Physical Exam Vitals and nursing note reviewed.  Constitutional:      General: She is not in acute distress.    Appearance: She is ill-appearing.  HENT:     Mouth/Throat:     Mouth: Mucous membranes are moist.     Pharynx: Oropharynx is clear. No pharyngeal swelling or oropharyngeal exudate.  Cardiovascular:     Rate and Rhythm: Regular rhythm. Tachycardia present.     Heart sounds: No murmur heard.    No friction  rub. No gallop.  Pulmonary:     Effort: Pulmonary effort is normal. No respiratory distress.     Breath sounds: No stridor. Rales (Bilateral at lung bases) present. No wheezing or rhonchi.  Chest:     Chest wall: Tenderness (Left-sided chest wall tenderness to palpation) present.  Abdominal:     General: Abdomen is flat. There is no distension. There are no signs of injury.     Tenderness: There is generalized abdominal tenderness and tenderness in the left upper quadrant. There is no right CVA tenderness, left CVA tenderness, guarding or rebound. Negative signs include Murphy's sign, Rovsing's sign, McBurney's sign, psoas sign and obturator sign.  Skin:    Coloration: Skin is not jaundiced, mottled or pale.  Neurological:     General: No focal deficit present.     Mental Status: She is alert and oriented to person, place, and time.     Motor: No weakness.  Psychiatric:        Mood and Affect: Mood normal.     ED Results / Procedures / Treatments   Labs (all labs ordered are listed, but only abnormal results are displayed) Labs Reviewed  CBC WITH DIFFERENTIAL/PLATELET - Abnormal; Notable for the following components:      Result Value   WBC 12.9 (*)    Neutro Abs 11.3 (*)    Abs Immature Granulocytes 0.11 (*)    All other components within normal limits  COMPREHENSIVE METABOLIC PANEL - Abnormal; Notable for the following components:   Sodium 130 (*)    CO2 17 (*)    Glucose, Bld 111 (*)    Calcium 8.8 (*)    Total Protein 6.3 (*)    Albumin 3.3 (*)    Total Bilirubin 2.3 (*)    All other components within normal limits  URINALYSIS, ROUTINE W REFLEX MICROSCOPIC - Abnormal; Notable for the following components:   Protein, ur 30 (*)    All other components within normal limits  URINALYSIS, MICROSCOPIC (REFLEX) - Abnormal; Notable for the following components:   Bacteria, UA FEW (*)    All other components within normal limits  LACTIC ACID, PLASMA - Abnormal; Notable for the  following components:   Lactic Acid, Venous 2.1 (*)    All other components within normal limits  RESP PANEL BY RT-PCR (RSV, FLU A&B, COVID)  RVPGX2  CULTURE, BLOOD (ROUTINE X 2)  CULTURE, BLOOD (ROUTINE X 2)  LIPASE, BLOOD  PREGNANCY, URINE  LACTIC ACID, PLASMA    EKG None  Radiology DG Chest 2 View Result Date: 07/02/2023 CLINICAL DATA:  Short of breath, hemoptysis EXAM: CHEST - 2 VIEW COMPARISON:  03/12/2022 FINDINGS: Frontal and lateral views of the chest demonstrate an unremarkable cardiac silhouette. Nodular areas of consolidation are seen in the right lower lobe, left upper lobe, left lower lobe.  There is also dense consolidation within the left lower lobe retrocardiac region. Small left effusion. No pneumothorax. No acute bony abnormalities. IMPRESSION: 1. Multifocal areas of consolidation at the lung bases, most pronounced within the left lower lobe. Findings are consistent with bilateral pneumonia in the appropriate clinical setting, and follow-up PA and lateral chest X-ray is recommended in 3-4 weeks following trial of antibiotic therapy to ensure resolution. 2. Small left pleural effusion. Electronically Signed   By: Sharlet Salina M.D.   On: 07/02/2023 12:55    Procedures .Critical Care  Performed by: Lunette Stands, PA-C Authorized by: Lunette Stands, PA-C   Critical care provider statement:    Critical care time (minutes):  54   Critical care time was exclusive of:  Separately billable procedures and treating other patients   Critical care was necessary to treat or prevent imminent or life-threatening deterioration of the following conditions:  Sepsis   Critical care was time spent personally by me on the following activities:  Development of treatment plan with patient or surrogate, discussions with consultants, evaluation of patient's response to treatment, examination of patient, ordering and review of laboratory studies, ordering and review of radiographic studies,  ordering and performing treatments and interventions, pulse oximetry, re-evaluation of patient's condition, review of old charts, blood draw for specimens, discussions with primary provider and obtaining history from patient or surrogate   I assumed direction of critical care for this patient from another provider in my specialty: yes     Care discussed with: admitting provider       Medications Ordered in ED Medications  cefTRIAXone (ROCEPHIN) 1 g in sodium chloride 0.9 % 100 mL IVPB (0 g Intravenous Stopped 07/02/23 1436)  azithromycin (ZITHROMAX) 500 mg in sodium chloride 0.9 % 250 mL IVPB (0 mg Intravenous Stopped 07/02/23 1546)  acetaminophen (TYLENOL) tablet 650 mg (650 mg Oral Given 07/02/23 1358)  sodium chloride 0.9 % bolus 1,000 mL ( Intravenous Stopped 07/02/23 1503)  sodium chloride 0.9 % bolus 1,000 mL (1,000 mLs Intravenous New Bag/Given 07/02/23 1527)  ketorolac (TORADOL) 15 MG/ML injection 15 mg (15 mg Intravenous Given 07/02/23 1527)    ED Course/ Medical Decision Making/ A&P                                 Medical Decision Making Amount and/or Complexity of Data Reviewed Labs: ordered. Radiology: ordered.  Risk OTC drugs. Prescription drug management. Decision regarding hospitalization.   This patient is a 28 year old female who presents to the ED for concern of shortness of breath companied with left upper quadrant abdominal pain, chest wall tenderness with fever.    Differential diagnoses prior to evaluation: The emergent differential diagnosis includes, but is not limited to, pneumonia, PE, ACS, asthma exacerbation, bronchitis, URI, sepsis. This is not an exhaustive differential.    Past Medical History / Co-morbidities / Social History: Asthma, cholecystectomy, marijuana use, HSV 1, anemia   Additional history: Chart reviewed. Pertinent results include:    Previously seen on 06/29/2023 for asthma exacerbation at urgent care.  Was provided DuoNebs and prednisone with  improvement.  And was discharged with albuterol and prednisone.   Last exacerbation was in November 2023.   Lab Tests/Imaging studies: I personally interpreted labs/imaging and the pertinent results include:    CBC was notable for an elevated white count with 12.9 UA was notable for protein in the urine Lactic acid was 2.1, repeat pending  UA was notable for few bacteria with no nitrites and no WBCs on microscopy Lipase unremarkable Pregnancy test negative CMP notable for decreased bicarb of 17 and decreased sodium of 130 with an elevated bilirubin of 2.3 Respiratory panel negative Chest x-ray notable for a multifocal pneumonia at the lung bases bilaterally most pronounced in the left lower lobe with small left pleural effusion. I agree with the radiologist interpretation.     Medications: I ordered medication including 2 L of normal saline, Toradol, Zithromycin, ceftriaxone, Tylenol.  I have reviewed the patients home medicines and have made adjustments as needed.     ED Course: Patient is a 28 year old female presents ED today complaining of left upper quadrant abdominal pain with shortness of breath and syncope versus near syncope that started since last night accompanied with hemoptysis.  Has been sick for the last 2 days with URI-like symptoms.  Seen by urgent care and was given prednisone and albuterol for asthma exacerbation.  Previous medical history of asthma.  Denies any GI symptoms currently.  On initial presentation patient was notably in pain and tachycardic.  Afebrile at that time, alert and oriented x 4, no acute distress.  Notably tender over left chest wall and left upper quadrant of the abdomen.  And noted to have rales in lung bases bilaterally.  Initial labs were then done and chest x-ray was done showing multilobar pneumonia.  Upon reevaluation, patient was noted to have mild hypotension with continued tachycardia as well as continued pain.  At that point, blood cultures  were drawn and lactate was drawn and began sepsis workup.  Provided 2 L of fluid, Rocephin, azithromycin, Toradol for pain and Tylenol.  As patient had noted to have taken Rocephin in 2022 without complications.  Patient's fever then abated with improved symptoms.  Lactate was noted to be 2.1.  Looking to admit to hospitalist.  Patient case was discussed with attending who agreed with plan.  Dr. Sharl Ma was then consulted and patient was admitted to hospitalist.  Patient is satting well at this time on room air.  No acute distress.  However continued tachycardia and hypotension still present.  Was noted to have the patient mated to progressive unit.  While waiting for transport, patient was still noted to be mildly hypotensive and tachycardic despite 2 L of fluid.  Patient was then placed on the continuous saline drip an hour to continue to support BP.   Disposition: After consideration of the diagnostic results and the patients response to treatment, I feel that the patient benefit from admission and further treatment, workup.  Patient care transferred over to Dr. Sharl Ma.  Final Clinical Impression(s) / ED Diagnoses Final diagnoses:  Community acquired pneumonia, unspecified laterality    Rx / DC Orders ED Discharge Orders     None         Lunette Stands, PA-C 07/02/23 [address] [place], [place] 07/02/23 1729    Rolan Bucco, MD 07/05/23 [phone]

## 2023-07-02 NOTE — H&P (Signed)
 History and Physical    SAGA BALTHAZAR NWG:[id number] DOB: [birth year] DOA: 07/02/2023  PCP: Pcp, No  Patient coming from: [facility] ED  Chief Complaint: Syncope  HPI: Megan Barnes is a 28 y.o. female with medical history significant of asthma, depression, marijuana abuse presented to the ED for evaluation of hemoptysis, left upper quadrant abdominal/flank pain, and syncope.  Patient was febrile, tachycardic, and tachypneic in the ED.  Blood pressure low with systolic in the 90s.  Not hypoxic.  Labs showing WBC count 12.9, sodium 130, bicarb 17, anion gap 12, glucose 111, creatinine 0.8, T. bili 2.3, transaminases and alkaline phosphatase normal, lipase normal, COVID/influenza/RSV PCR negative, UA not suggestive of infection, urine pregnancy test negative, lactic acid 2.1> 2.3, blood cultures collected.  Chest x-ray showing multifocal areas of consolidation at the lung bases, most pronounced within the left lower lobe.  Findings are consistent with bilateral pneumonia.  Also showing small left pleural effusion. Patient was given Tylenol, Toradol, ceftriaxone, azithromycin, 2 L normal saline boluses and started on normal saline continuous infusion at 125 mL/hr in the ED.  Blood pressure improved after IV fluids.  Patient states she has been feeling ill for the past 2 days.  Having fevers, chills, cough productive of pink-colored/blood-tinged sputum, and shortness of breath.  Also having sharp left-sided chest/upper abdominal pain which radiates to her flank region and pain is worse every time she coughs.  She has not been eating or drinking much.  No nausea, vomiting, or diarrhea.  This morning she went to use the bathroom and as she was getting up from the toilet she passed out.  Next thing she remembers is waking up on the bathroom floor.  She was alone at home.  She is having headaches every time she coughs and also endorsing some pain in her neck.  No known sick contacts.  She is using her home  inhalers including Symbicort and albuterol.  Patient feels hungry and is requesting food.  Review of Systems:  Review of Systems  All other systems reviewed and are negative.   Past Medical History:  Diagnosis Date   Abdominal pain    Anemia    Asthma    Depression    Pneumonia     Past Surgical History:  Procedure Laterality Date   CHOLECYSTECTOMY N/A 03/05/2015   Procedure: LAPAROSCOPIC CHOLECYSTECTOMY ;  Surgeon: Lattie Haw, MD;  Location: [facility] ORS;  Service: General;  Laterality: N/A;   POLYDACTYLY RECONSTRUCTION       reports that she has never smoked. She has never used smokeless tobacco. She reports current alcohol use of about 3.0 standard drinks of alcohol per week. She reports current drug use. Drug: Marijuana.  Allergies  Allergen Reactions   Penicillins     Family History  Problem Relation Age of Onset   Cancer Maternal Aunt    Diabetes Maternal Uncle    Cancer Maternal Uncle    Cancer Maternal Grandmother    Diabetes Maternal Grandfather     Prior to Admission medications   Medication Sig Start Date End Date Taking? Authorizing Provider  acyclovir (ZOVIRAX) 800 MG tablet Take 1 tablet (800 mg total) by mouth 2 (two) times daily. 02/11/21   Wendi Snipes, FNP  albuterol (PROVENTIL) (2.5 MG/3ML) 0.083% nebulizer solution Take 3 mLs (2.5 mg total) by nebulization every 4 (four) hours as needed for wheezing or shortness of breath. 03/12/22 03/12/23  Merwyn Katos, MD  albuterol (VENTOLIN HFA) 108 (90 Base) MCG/ACT inhaler  Inhale 2 puffs into the lungs every 6 (six) hours as needed for wheezing or shortness of breath. 12/03/21   Varney Daily, PA  azithromycin (ZITHROMAX Z-PAK) 250 MG tablet 2 pills today then 1 pill a day for 4 days 12/31/21   Sherrie Mustache Roselyn Bering, PA-C  budesonide-formoterol [facility]) 80-4.5 MCG/ACT inhaler Inhale 2 puffs into the lungs daily. 03/12/22   Merwyn Katos, MD  dicyclomine (BENTYL) 20 MG tablet Take 1 tablet (20 mg total)  by mouth 3 (three) times daily as needed (abdominal pain). Patient not taking: No sig reported 06/10/18   Phineas Semen, MD  fexofenadine-pseudoephedrine (ALLEGRA-D) 60-120 MG 12 hr tablet Take 1 tablet by mouth 2 (two) times daily. 09/13/19   Joni Reining, PA-C  fluticasone (FLONASE) 50 MCG/ACT nasal spray Place 2 sprays into both nostrils daily. 09/08/15 09/07/16  Tommi Rumps, PA-C  fluticasone-salmeterol (WIXELA INHUB) 250-50 MCG/ACT AEPB Inhale 1 puff into the lungs in the morning and at bedtime. 12/03/21 01/02/22  Varney Daily, PA  ibuprofen (ADVIL) 600 MG tablet Take 1 tablet (600 mg total) by mouth every 8 (eight) hours as needed. 02/15/19   Joni Reining, PA-C  ipratropium-albuterol (DUONEB) 0.5-2.5 (3) MG/3ML SOLN Take 3 mLs by nebulization every 4 (four) hours as needed. 12/31/21   Fisher, Roselyn Bering, PA-C  montelukast (SINGULAIR) 10 MG tablet Take 10 mg by mouth at bedtime.    [provider]  predniSONE (DELTASONE) 10 MG tablet Take 2 tablets (20 mg total) by mouth 2 (two) times daily. 06/29/23   Geoffery Lyons, MD    Physical Exam: Vitals:   07/02/23 1630 07/02/23 1700 07/02/23 1800 07/02/23 1900  BP: (!) 96/48 (!) 106/34 (!) 112/49 (!) 117/53  Pulse: (!) 119 (!) 116 (!) 116 (!) 125  Resp: (!) 22 (!) 27 (!) 29 20  Temp:    99.1 F (37.3 C)  TempSrc:    Oral  SpO2: 92% 93% 92% 97%  Weight:        Physical Exam Vitals reviewed.  Constitutional:      Appearance: She is ill-appearing.  HENT:     Head: Normocephalic and atraumatic.     Mouth/Throat:     Mouth: Mucous membranes are dry.  Eyes:     Extraocular Movements: Extraocular movements intact.  Cardiovascular:     Rate and Rhythm: Normal rate and regular rhythm.     Pulses: Normal pulses.  Pulmonary:     Effort: Pulmonary effort is normal. No respiratory distress.     Breath sounds: Rhonchi present. No wheezing.     Comments: Slightly tachypneic Abdominal:     General: Bowel sounds are normal. There  is no distension.     Palpations: Abdomen is soft.     Tenderness: There is no abdominal tenderness. There is no guarding or rebound.  Musculoskeletal:     Cervical back: Normal range of motion. No rigidity.     Right lower leg: No edema.     Left lower leg: No edema.  Skin:    General: Skin is warm and dry.  Neurological:     General: No focal deficit present.     Mental Status: She is alert and oriented to person, place, and time.     Cranial Nerves: No cranial nerve deficit.     Sensory: No sensory deficit.     Motor: No weakness.     Labs on Admission: I have personally reviewed following labs and imaging studies  CBC:  Recent Labs  Lab 07/02/23 1118  WBC 12.9*  NEUTROABS 11.3*  HGB 12.5  HCT 38.4  MCV 83.1  PLT 261   Basic Metabolic Panel: Recent Labs  Lab 07/02/23 1118  NA 130*  K 3.5  CL 101  CO2 17*  GLUCOSE 111*  BUN 14  CREATININE 0.82  CALCIUM 8.8*   GFR: Estimated Creatinine Clearance: 125.6 mL/min (by C-G formula based on SCr of 0.82 mg/dL). Liver Function Tests: Recent Labs  Lab 07/02/23 1118  AST 27  ALT 44  ALKPHOS 72  BILITOT 2.3*  PROT 6.3*  ALBUMIN 3.3*   Recent Labs  Lab 07/02/23 1118  LIPASE 22   No results for input(s): "AMMONIA" in the last 168 hours. Coagulation Profile: No results for input(s): "INR", "PROTIME" in the last 168 hours. Cardiac Enzymes: No results for input(s): "CKTOTAL", "CKMB", "CKMBINDEX", "TROPONINI" in the last 168 hours. BNP (last 3 results) No results for input(s): "PROBNP" in the last 8760 hours. HbA1C: No results for input(s): "HGBA1C" in the last 72 hours. CBG: No results for input(s): "GLUCAP" in the last 168 hours. Lipid Profile: No results for input(s): "CHOL", "HDL", "LDLCALC", "TRIG", "CHOLHDL", "LDLDIRECT" in the last 72 hours. Thyroid Function Tests: No results for input(s): "TSH", "T4TOTAL", "FREET4", "T3FREE", "THYROIDAB" in the last 72 hours. Anemia Panel: No results for input(s):  "VITAMINB12", "FOLATE", "FERRITIN", "TIBC", "IRON", "RETICCTPCT" in the last 72 hours. Urine analysis:    Component Value Date/Time   COLORURINE YELLOW 07/02/2023 1256   APPEARANCEUR CLEAR 07/02/2023 1256   APPEARANCEUR Hazy 01/24/2013 2221   LABSPEC 1.015 07/02/2023 1256   LABSPEC 1.018 01/24/2013 2221   PHURINE 6.5 07/02/2023 1256   GLUCOSEU NEGATIVE 07/02/2023 1256   GLUCOSEU Negative 01/24/2013 2221   HGBUR NEGATIVE 07/02/2023 1256   BILIRUBINUR NEGATIVE 07/02/2023 1256   BILIRUBINUR Negative 01/24/2013 2221   KETONESUR NEGATIVE 07/02/2023 1256   PROTEINUR 30 (A) 07/02/2023 1256   NITRITE NEGATIVE 07/02/2023 1256   LEUKOCYTESUR NEGATIVE 07/02/2023 1256   LEUKOCYTESUR Negative 01/24/2013 2221    Radiological Exams on Admission: DG Chest 2 View Result Date: 07/02/2023 CLINICAL DATA:  Short of breath, hemoptysis EXAM: CHEST - 2 VIEW COMPARISON:  03/12/2022 FINDINGS: Frontal and lateral views of the chest demonstrate an unremarkable cardiac silhouette. Nodular areas of consolidation are seen in the right lower lobe, left upper lobe, left lower lobe. There is also dense consolidation within the left lower lobe retrocardiac region. Small left effusion. No pneumothorax. No acute bony abnormalities. IMPRESSION: 1. Multifocal areas of consolidation at the lung bases, most pronounced within the left lower lobe. Findings are consistent with bilateral pneumonia in the appropriate clinical setting, and follow-up PA and lateral chest X-ray is recommended in 3-4 weeks following trial of antibiotic therapy to ensure resolution. 2. Small left pleural effusion. Electronically Signed   By: Sharlet Salina M.D.   On: 07/02/2023 12:55    EKG: Pending at this time.  Assessment and Plan  Severe sepsis secondary to multifocal pneumonia Meets SIRS criteria with fever, tachycardia, tachypnea, and leukocytosis.  Blood pressure low with systolic in the 90s, now improved after IV fluids.  However, continues to  be tachycardic and slightly tachypneic.  Lactate slightly elevated.  Chest x-ray showing multifocal areas of consolidation at the lung bases, most pronounced within the left lower lobe.  Findings are consistent with bilateral pneumonia.  Also showing small left pleural effusion. COVID/influenza/RSV PCR negative.  Patient is having slight hemoptysis.  Not hypoxic. -Continue ceftriaxone and azithromycin.  EKG ordered to check QT interval. -Continue IV fluid hydration and trend lactate -Trend WBC count -Follow-up blood cultures -Sputum Gram stain and culture -Respiratory viral panel -Strep pneumo and Legionella urinary antigens -Antitussive as needed -Bronchodilator as needed -Acetaminophen as needed for fevers -Continuous pulse ox, supplemental oxygen as needed  Syncope and fall Syncope likely orthostatic from dehydration. -Echocardiogram ordered to check valvular function as no previous echo results in the chart. -Continue IV fluid hydration and check orthostatics -Fall precautions -Patient is endorsing headaches and neck pain.  Stat CT head/C-spine ordered.  Left sided chest pain, abdominal left upper quadrant pain/left-sided flank pain Pain appears to be pleuritic in nature and likely related to pneumonia.  No nausea or vomiting.  Abdominal exam is benign.  Lipase normal.  UA not suggestive of infection.  Stat EKG ordered and check troponin.  Mild hyponatremia Likely due to poor p.o. intake.  Continue IV fluid hydration and monitor sodium level.  Metabolic acidosis Continue IV fluid hydration and monitor labs.  Replace bicarb if needed.  Mild elevation of total bilirubin Likely related to sepsis.  Transaminases and alkaline phosphatase normal.  No abdominal pain or tenderness.  Monitor LFTs.  Asthma Stable, no wheezing. Continue Symbicort, Xopenex neb PRN.  DVT prophylaxis: SCDs Code Status: Full Code (discussed with the patient) Family Communication: No family available at this  time. Level of care: Progressive Care Unit Admission status: It is my clinical opinion that admission to INPATIENT is reasonable and necessary because of the expectation that this patient will require hospital care that crosses at least 2 midnights to treat this condition based on the medical complexity of the problems presented.  Given the aforementioned information, the predictability of an adverse outcome is felt to be significant.  John Giovanni MD [facility]  If 7PM-7AM, please contact night-coverage [email or web address]  07/02/2023, 7:26 PM

## 2023-07-03 ENCOUNTER — Inpatient Hospital Stay (HOSPITAL_COMMUNITY)

## 2023-07-03 DIAGNOSIS — R55 Syncope and collapse: Secondary | ICD-10-CM

## 2023-07-03 DIAGNOSIS — J189 Pneumonia, unspecified organism: Secondary | ICD-10-CM | POA: Diagnosis not present

## 2023-07-03 LAB — CBC
HCT: 38.3 % (ref 36.0–46.0)
Hemoglobin: 11.8 g/dL — ABNORMAL LOW (ref 12.0–15.0)
MCH: 27 pg (ref 26.0–34.0)
MCHC: 30.8 g/dL (ref 30.0–36.0)
MCV: 87.6 fL (ref 80.0–100.0)
Platelets: 250 10*3/uL (ref 150–400)
RBC: 4.37 MIL/uL (ref 3.87–5.11)
RDW: 14.6 % (ref 11.5–15.5)
WBC: 12.4 10*3/uL — ABNORMAL HIGH (ref 4.0–10.5)
nRBC: 0 % (ref 0.0–0.2)

## 2023-07-03 LAB — RESPIRATORY PANEL BY PCR

## 2023-07-03 LAB — EXPECTORATED SPUTUM ASSESSMENT W GRAM STAIN, RFLX TO RESP C

## 2023-07-03 LAB — TROPONIN I (HIGH SENSITIVITY): Troponin I (High Sensitivity): 4 ng/L (ref <18)

## 2023-07-03 LAB — COMPREHENSIVE METABOLIC PANEL
ALT: 32 U/L (ref 0–44)
AST: 22 U/L (ref 15–41)
Albumin: 3 g/dL — ABNORMAL LOW (ref 3.5–5.0)
Alkaline Phosphatase: 69 U/L (ref 38–126)
Anion gap: 12 (ref 5–15)
BUN: 13 mg/dL (ref 6–20)
CO2: 17 mmol/L — ABNORMAL LOW (ref 22–32)
Calcium: 8.2 mg/dL — ABNORMAL LOW (ref 8.9–10.3)
Chloride: 103 mmol/L (ref 98–111)
Creatinine, Ser: 0.91 mg/dL (ref 0.44–1.00)
GFR, Estimated: >60 mL/min (ref >60)
Glucose, Bld: 108 mg/dL — ABNORMAL HIGH (ref 70–99)
Potassium: 3.3 mmol/L — ABNORMAL LOW (ref 3.5–5.1)
Sodium: 132 mmol/L — ABNORMAL LOW (ref 135–145)
Total Bilirubin: 2.1 mg/dL — ABNORMAL HIGH (ref 0.0–1.2)
Total Protein: 6.1 g/dL — ABNORMAL LOW (ref 6.5–8.1)

## 2023-07-03 LAB — GLUCOSE, CAPILLARY
Glucose-Capillary: 301 mg/dL — ABNORMAL HIGH (ref 70–99)
Glucose-Capillary: 84 mg/dL (ref 70–99)
Glucose-Capillary: 85 mg/dL (ref 70–99)

## 2023-07-03 LAB — HEMOGLOBIN A1C
Hgb A1c MFr Bld: 5.4 % (ref 4.8–5.6)
Mean Plasma Glucose: 108.28 mg/dL

## 2023-07-03 LAB — PROCALCITONIN: Procalcitonin: 8.28 ng/mL

## 2023-07-03 LAB — ECHOCARDIOGRAM COMPLETE
AR max vel: 2.27 cm2
AV Area VTI: 2.39 cm2
AV Area mean vel: 2.47 cm2
AV Mean grad: 4 mmHg
AV Peak grad: 8.8 mmHg
Ao pk vel: 1.48 m/s
Area-P 1/2: 5.23 cm2
S' Lateral: 4.1 cm
Weight: 3368.63 [oz_av]

## 2023-07-03 LAB — HIV ANTIBODY (ROUTINE TESTING W REFLEX): HIV Screen 4th Generation wRfx: NONREACTIVE

## 2023-07-03 LAB — BRAIN NATRIURETIC PEPTIDE: B Natriuretic Peptide: 90.3 pg/mL (ref 0.0–100.0)

## 2023-07-03 LAB — STREP PNEUMONIAE URINARY ANTIGEN: Strep Pneumo Urinary Antigen: NEGATIVE

## 2023-07-03 LAB — LACTIC ACID, PLASMA: Lactic Acid, Venous: 1.9 mmol/L (ref 0.5–1.9)

## 2023-07-03 MED ORDER — METOPROLOL TARTRATE 5 MG/5ML IV SOLN: 5.0000 mg | INTRAVENOUS | Status: DC | PRN

## 2023-07-03 MED ORDER — ONDANSETRON HCL 4 MG/2ML IJ SOLN: 4.0000 mg | Freq: Four times a day (QID) | INTRAMUSCULAR | Status: DC | PRN

## 2023-07-03 MED ORDER — POTASSIUM CHLORIDE CRYS ER 20 MEQ PO TBCR
40.0000 meq | EXTENDED_RELEASE_TABLET | Freq: Once | ORAL | Status: AC
  Administered 2023-07-03: 40 meq via ORAL
  Filled 2023-07-03: qty 2

## 2023-07-03 MED ORDER — OXYCODONE HCL 5 MG PO TABS
5.0000 mg | ORAL_TABLET | ORAL | Status: DC | PRN
  Administered 2023-07-03 – 2023-07-04 (×2): 5 mg via ORAL
  Filled 2023-07-03 (×2): qty 1

## 2023-07-03 MED ORDER — INSULIN ASPART 100 UNIT/ML IJ SOLN: 0.0000 [IU] | Freq: Three times a day (TID) | INTRAMUSCULAR | Status: DC

## 2023-07-03 MED ORDER — HYDRALAZINE HCL 20 MG/ML IJ SOLN: 10.0000 mg | INTRAMUSCULAR | Status: DC | PRN

## 2023-07-03 MED ORDER — INSULIN ASPART 100 UNIT/ML IJ SOLN: 0.0000 [IU] | Freq: Every day | INTRAMUSCULAR | Status: DC

## 2023-07-03 MED ORDER — SENNOSIDES-DOCUSATE SODIUM 8.6-50 MG PO TABS: 1.0000 | ORAL_TABLET | Freq: Every evening | ORAL | Status: DC | PRN

## 2023-07-03 MED ORDER — TRAZODONE HCL 50 MG PO TABS: 50.0000 mg | ORAL_TABLET | Freq: Every evening | ORAL | Status: DC | PRN

## 2023-07-03 NOTE — Progress Notes (Signed)
   07/02/23 2245  Assess: MEWS Score  Temp (!) 102.5 F (39.2 C)  BP (!) 103/35  MAP (mmHg) (!) 55  Pulse Rate (!) 136  Resp 20  SpO2 95 %  O2 Device Room Air  Assess: MEWS Score  MEWS Temp 2  MEWS Systolic 0  MEWS Pulse 3  MEWS RR 0  MEWS LOC 0  MEWS Score 5  MEWS Score Color Red  Assess: if the MEWS score is Yellow or Red  Were vital signs accurate and taken at a resting state? Yes  Does the patient meet 2 or more of the SIRS criteria? Yes  Does the patient have a confirmed or suspected source of infection? Yes  MEWS guidelines implemented  Yes, red  Treat  MEWS Interventions Considered administering scheduled or prn medications/treatments as ordered  Take Vital Signs  Increase Vital Sign Frequency  Red: Q1hr x2, continue Q4hrs until patient remains green for 12hrs  Escalate  MEWS: Escalate Red: Discuss with charge nurse and notify provider. Consider notifying RRT. If remains red for 2 hours consider need for higher level of care  Notify: Charge Nurse/RN  Name of Charge Nurse/RN Notified Lauren, RN  Provider Notification  Provider Name/Title Chinita Greenland, NP  Date Provider Notified 07/02/23  Time Provider Notified 2300  Method of Notification Page  Notification Reason Other (Comment) (Red mews)  Notify: Rapid Response  Name of Rapid Response RN Notified Genell, RN  Date Rapid Response Notified 07/02/23  Time Rapid Response Notified 2300  Assess: SIRS CRITERIA  SIRS Temperature  1  SIRS Respirations  0  SIRS Pulse 1  SIRS WBC 1  SIRS Score Sum  3

## 2023-07-03 NOTE — TOC Initial Note (Signed)
 Transition of Care [facility]) - Initial/Assessment Note    Patient Details  Name: Megan Barnes MRN: [id number] Date of Birth: 1995/12/19  Transition of Care [facility]) CM/SW Contact:    Lanier Clam, RN Phone Number: 07/03/2023, 2:07 PM  Clinical Narrative:  d/c plan home.                 Expected Discharge Plan: Home/Self Care Barriers to Discharge: Continued Medical Work up   Patient Goals and CMS Choice Patient states their goals for this hospitalization and ongoing recovery are:: Home CMS Medicare.gov Compare Post Acute Care list provided to:: Patient Choice offered to / list presented to : Patient [place] ownership interest in [facility].provided to:: Patient    Expected Discharge Plan and Services       Living arrangements for the past 2 months: Single Family Home                                      Prior Living Arrangements/Services Living arrangements for the past 2 months: Single Family Home Lives with:: Self                   Activities of Daily Living      Permission Sought/Granted                  Emotional Assessment              Admission diagnosis:  CAP (community acquired pneumonia) [J18.9] Community acquired pneumonia, unspecified laterality [J18.9] Patient Active Problem List   Diagnosis Date Noted   Multifocal pneumonia 07/02/2023   Severe sepsis (HCC) 07/02/2023   Syncope 07/02/2023   Chest pain 07/02/2023   Hyponatremia 07/02/2023   Metabolic acidosis 07/02/2023   Serum total bilirubin elevated 07/02/2023   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   Morbid obesity (HCC) 236 lbs 01/29/2021   Asthma 01/29/2021   Marijuana use 01/29/2021   Depression, major, 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 [place] [address] Phone: [phone] Fax: [phone]  [facility][id number] [place], [place] - [address] AT [facility] OF [facility] [address] [place] [address] Phone: [phone] Fax: [phone]     Social Drivers of Health (SDOH) Social History: SDOH Screenings   Tobacco Use: Low Risk  (07/02/2023)   SDOH Interventions:     Readmission Risk Interventions     No data to display

## 2023-07-03 NOTE — Progress Notes (Signed)
 PROGRESS NOTE    RAMISA DUMAN  HYQ:[id number] DOB: Aug 06, 1995 DOA: 07/02/2023 PCP: Pcp, No    Brief Narrative:   28 year old with history of asthma, depression, marijuana use comes to the ED with complaints of hemoptysis, left upper quadrant abdominal pain and syncope.  Upon admission noted to be tachycardic and hypotensive.  CT suggestive of multifocal pneumonia, started on Rocephin and azithromycin.  Assessment & Plan:  Principal Problem:   Multifocal pneumonia Active Problems:   Severe sepsis (HCC)   Syncope   Chest pain   Hyponatremia   Metabolic acidosis   Serum total bilirubin elevated     Severe sepsis secondary to multifocal pneumonia -Concerns of multifocal pneumonia on the chest x-ray.  Sepsis physiology overall stabilizing slowly -COVID/RSV/flu-negative.  RSV P-pending -Strep pneumo and Legionella antigen- - Empiric Rocephin and azithromycin - Bronchodilators, I-S/flutter valve   Syncope and fall -Suspect from dehydration.  CT head and cervical spine negative - Getting IV fluids - Echocardiogram.   Left sided chest pain, abdominal left upper quadrant pain/left-sided flank pain EKG showing sinus tachycardia.  Troponins are flat.   Mild hyponatremia Dehydration and metabolic acidosis IV fluids   Mild elevation of total bilirubin Likely in the setting of acute infection.  Will continue to monitor   Asthma No acute exacerbation noted.    DVT prophylaxis: SCDs Start: 07/02/23 2003    Code Status: Full Code Family Communication:   Status is: Inpatient Remains inpatient appropriate because: Still having diminished breath sounds.  Continue hospital stay for at least next 24-48 hours    Subjective: Overall still feel weak   Examination:  General exam: Appears calm and comfortable  Respiratory system: Bilateral diminished breath sounds Cardiovascular system: S1 & S2 heard, RRR. No JVD, murmurs, rubs, gallops or clicks. No pedal  edema. Gastrointestinal system: Abdomen is nondistended, soft and nontender. No organomegaly or masses felt. Normal bowel sounds heard. Central nervous system: Alert and oriented. No focal neurological deficits. Extremities: Symmetric 5 x 5 power. Skin: No rashes, lesions or ulcers Psychiatry: Judgement and insight appear normal. Mood & affect appropriate.                Diet Orders (From admission, onward)     Start     Ordered   07/02/23 2004  Diet regular Room service appropriate? Yes; Fluid consistency: Thin  Diet effective now       Question Answer Comment  Room service appropriate? Yes   Fluid consistency: Thin      07/02/23 2007            Objective: Vitals:   07/03/23 0435 07/03/23 0440 07/03/23 0443 07/03/23 0940  BP: (!) 113/57 (!) 118/57 (!) 119/53   Pulse: (!) 107 (!) 110 (!) 120   Resp: 18 18 18    Temp: 98.1 F (36.7 C) 98.3 F (36.8 C) 98.3 F (36.8 C)   TempSrc: Oral Oral    SpO2: 96% 97% 99% 96%  Weight:        Intake/Output Summary (Last 24 hours) at 07/03/2023 1101 Last data filed at 07/03/2023 0544 Gross per 24 hour  Intake 3661.93 ml  Output 600 ml  Net 3061.93 ml   Filed Weights   07/02/23 1034  Weight: 95.5 kg    Scheduled Meds:  mometasone-formoterol  2 puff Inhalation BID   Continuous Infusions:  sodium chloride 125 mL/hr at 07/03/23 0236   azithromycin Stopped (07/02/23 1546)   cefTRIAXone (ROCEPHIN)  IV Stopped (07/02/23 1436)  Nutritional status     Body mass index is 32.01 kg/m.  Data Reviewed:   CBC: Recent Labs  Lab 07/02/23 1118 07/02/23 2339  WBC 12.9* 12.4*  NEUTROABS 11.3*  --   HGB 12.5 11.8*  HCT 38.4 38.3  MCV 83.1 87.6  PLT 261 250   Basic Metabolic Panel: Recent Labs  Lab 07/02/23 1118 07/02/23 2339  NA 130* 132*  K 3.5 3.3*  CL 101 103  CO2 17* 17*  GLUCOSE 111* 108*  BUN 14 13  CREATININE 0.82 0.91  CALCIUM 8.8* 8.2*   GFR: Estimated Creatinine Clearance: 113.1 mL/min (by  C-G formula based on SCr of 0.91 mg/dL). Liver Function Tests: Recent Labs  Lab 07/02/23 1118 07/02/23 2339  AST 27 22  ALT 44 32  ALKPHOS 72 69  BILITOT 2.3* 2.1*  PROT 6.3* 6.1*  ALBUMIN 3.3* 3.0*   Recent Labs  Lab 07/02/23 1118  LIPASE 22   No results for input(s): "AMMONIA" in the last 168 hours. Coagulation Profile: No results for input(s): "INR", "PROTIME" in the last 168 hours. Cardiac Enzymes: No results for input(s): "CKTOTAL", "CKMB", "CKMBINDEX", "TROPONINI" in the last 168 hours. BNP (last 3 results) No results for input(s): "PROBNP" in the last 8760 hours. HbA1C: No results for input(s): "HGBA1C" in the last 72 hours. CBG: Recent Labs  Lab 07/02/23 2136  GLUCAP 95   Lipid Profile: No results for input(s): "CHOL", "HDL", "LDLCALC", "TRIG", "CHOLHDL", "LDLDIRECT" in the last 72 hours. Thyroid Function Tests: No results for input(s): "TSH", "T4TOTAL", "FREET4", "T3FREE", "THYROIDAB" in the last 72 hours. Anemia Panel: No results for input(s): "VITAMINB12", "FOLATE", "FERRITIN", "TIBC", "IRON", "RETICCTPCT" in the last 72 hours. Sepsis Labs: Recent Labs  Lab 07/02/23 1345 07/02/23 1630 07/02/23 2137  LATICACIDVEN 2.1* 2.3* 2.5*    Recent Results (from the past 240 hours)  Resp panel by RT-PCR (RSV, Flu A&B, Covid) Anterior Nasal Swab     Status: None   Collection Time: 07/02/23 10:36 AM   Specimen: Anterior Nasal Swab  Result Value Ref Range Status   SARS Coronavirus 2 by RT PCR NEGATIVE NEGATIVE Final    Comment: (NOTE) SARS-CoV-2 target nucleic acids are NOT DETECTED.  The SARS-CoV-2 RNA is generally detectable in upper respiratory specimens during the acute phase of infection. The lowest concentration of SARS-CoV-2 viral copies this assay can detect is 138 copies/mL. A negative result does not preclude SARS-Cov-2 infection and should not be used as the sole basis for treatment or other patient management decisions. A negative result may occur  with  improper specimen collection/handling, submission of specimen other than nasopharyngeal swab, presence of viral mutation(s) within the areas targeted by this assay, and inadequate number of viral copies(<138 copies/mL). A negative result must be combined with clinical observations, patient history, and epidemiological information. The expected result is Negative.  Fact Sheet for Patients:  BloggerCourse.com  Fact Sheet for Healthcare Providers:  SeriousBroker.it  This test is no t yet approved or cleared by the [place] FDA and  has been authorized for detection and/or diagnosis of SARS-CoV-2 by FDA under an Emergency Use Authorization (EUA). This EUA will remain  in effect (meaning this test can be used) for the duration of the COVID-19 declaration under Section 564(b)(1) of the Act, 21 U.S.C.section 360bbb-3(b)(1), unless the authorization is terminated  or revoked sooner.       Influenza A by PCR NEGATIVE NEGATIVE Final   Influenza B by PCR NEGATIVE NEGATIVE Final    Comment: (NOTE)  The Xpert Xpress SARS-CoV-2/FLU/RSV plus assay is intended as an aid in the diagnosis of influenza from Nasopharyngeal swab specimens and should not be used as a sole basis for treatment. Nasal washings and aspirates are unacceptable for Xpert Xpress SARS-CoV-2/FLU/RSV testing.  Fact Sheet for Patients: BloggerCourse.com  Fact Sheet for Healthcare Providers: SeriousBroker.it  This test is not yet approved or cleared by the [place] FDA and has been authorized for detection and/or diagnosis of SARS-CoV-2 by FDA under an Emergency Use Authorization (EUA). This EUA will remain in effect (meaning this test can be used) for the duration of the COVID-19 declaration under Section 564(b)(1) of the Act, 21 U.S.C. section 360bbb-3(b)(1), unless the authorization is terminated  or revoked.     Resp Syncytial Virus by PCR NEGATIVE NEGATIVE Final    Comment: (NOTE) Fact Sheet for Patients: BloggerCourse.com  Fact Sheet for Healthcare Providers: SeriousBroker.it  This test is not yet approved or cleared by the [place] FDA and has been authorized for detection and/or diagnosis of SARS-CoV-2 by FDA under an Emergency Use Authorization (EUA). This EUA will remain in effect (meaning this test can be used) for the duration of the COVID-19 declaration under Section 564(b)(1) of the Act, 21 U.S.C. section 360bbb-3(b)(1), unless the authorization is terminated or revoked.  Performed at [facility], [address] [place], [place] [address]   Blood culture (routine x 2)     Status: None (Preliminary result)   Collection Time: 07/02/23  1:45 PM   Specimen: BLOOD  Result Value Ref Range Status   Specimen Description   Final    BLOOD LEFT ANTECUBITAL Performed at [facility], [address] [place], [place] [address]    Special Requests   Final    BOTTLES DRAWN AEROBIC AND ANAEROBIC Blood Culture results may not be optimal due to an inadequate volume of blood received in culture bottles Performed at [facility], [address] [place], [place] [address]    Culture   Final    NO GROWTH < 24 HOURS Performed at [facility], [address] [place], [place] [address]    Report Status PENDING  Incomplete  Culture, blood (Routine X 2) w Reflex to ID Panel     Status: None (Preliminary result)   Collection Time: 07/02/23  7:40 PM   Specimen: BLOOD RIGHT HAND  Result Value Ref Range Status   Specimen Description   Final    BLOOD RIGHT HAND Performed at [facility], [address] [place], [place] [address]    Special Requests   Final    BOTTLES DRAWN AEROBIC ONLY Blood Culture results may not be optimal due to an inadequate volume of blood received in  culture bottles Performed at [facility], [address], [place], [place] [address]    Culture PENDING  Incomplete   Report Status PENDING  Incomplete  Expectorated Sputum Assessment w Gram Stain, Rflx to Resp Cult     Status: None   Collection Time: 07/03/23  7:25 AM   Specimen: Expectorated Sputum  Result Value Ref Range Status   Specimen Description EXPECTORATED SPUTUM  Final   Special Requests NONE  Final   Sputum evaluation   Final    Sputum specimen not acceptable for testing.  Please recollect.   INFORMED K.Harris,RN to recollect on 07/03/2023 at 0856 am by SL Performed at [facility], [address] [place],  [place] [address]    Report Status 07/03/2023 FINAL  Final         Radiology Studies: CT CERVICAL SPINE WO CONTRAST Result Date: 07/02/2023 CLINICAL DATA:  Left-sided neck pain, initial encounter EXAM: CT CERVICAL SPINE WITHOUT CONTRAST TECHNIQUE: Multidetector CT imaging of the cervical spine was performed without intravenous contrast. Multiplanar CT image reconstructions were also generated. RADIATION DOSE REDUCTION: This exam was performed according to the departmental dose-optimization program which includes automated exposure control, adjustment of the mA and/or kV according to patient size and/or use of iterative reconstruction technique. COMPARISON:  02/15/2019 FINDINGS: Alignment: Mild loss of the normal cervical lordosis is noted. Skull base and vertebrae: 7 cervical segments are well visualized. Vertebral body height is well maintained. No acute fracture or acute facet abnormality is noted. The odontoid is within normal limits. Soft tissues and spinal canal: Surrounding soft tissue structures are within normal limits. Upper chest: Visualized lung apices are unremarkable. Other: None IMPRESSION: No acute abnormality noted. Electronically Signed   By: Alcide Clever M.D.   On: 07/02/2023 21:42   CT HEAD WO CONTRAST ( ) Result  Date: 07/02/2023 CLINICAL DATA:  Initial evaluation for acute head trauma. EXAM: CT HEAD WITHOUT CONTRAST TECHNIQUE: Contiguous axial images were obtained from the base of the skull through the vertex without intravenous contrast. RADIATION DOSE REDUCTION: This exam was performed according to the departmental dose-optimization program which includes automated exposure control, adjustment of the mA and/or kV according to patient size and/or use of iterative reconstruction technique. COMPARISON:  None Available. FINDINGS: Brain: Cerebral volume within normal limits. No acute intracranial hemorrhage. No acute large vessel territory infarct. No mass lesion, midline shift or mass effect. No hydrocephalus or extra-axial fluid collection. Vascular: No abnormal hyperdense vessel. Skull: Scalp soft tissues within normal limits.  Calvarium intact. Sinuses/Orbits: Globes orbital soft tissues within normal limits. Scattered mucosal thickening noted about the ethmoidal air cells. Small volume pneumatized secretions noted within the right sphenoid sinus. Mastoid air cells are clear. Other: None. IMPRESSION: 1. Normal head CT.  No acute intracranial abnormality. 2. Mild sphenoethmoidal sinus disease. Electronically Signed   By: Rise Mu M.D.   On: 07/02/2023 21:41   DG Chest 2 View Result Date: 07/02/2023 CLINICAL DATA:  Short of breath, hemoptysis EXAM: CHEST - 2 VIEW COMPARISON:  03/12/2022 FINDINGS: Frontal and lateral views of the chest demonstrate an unremarkable cardiac silhouette. Nodular areas of consolidation are seen in the right lower lobe, left upper lobe, left lower lobe. There is also dense consolidation within the left lower lobe retrocardiac region. Small left effusion. No pneumothorax. No acute bony abnormalities. IMPRESSION: 1. Multifocal areas of consolidation at the lung bases, most pronounced within the left lower lobe. Findings are consistent with bilateral pneumonia in the appropriate clinical  setting, and follow-up PA and lateral chest X-ray is recommended in 3-4 weeks following trial of antibiotic therapy to ensure resolution. 2. Small left pleural effusion. Electronically Signed   By: Sharlet Salina M.D.   On: 07/02/2023 12:55           LOS: 1 day   Time spent= 35 mins    Miguel Rota, MD [facility]  If 7PM-7AM, please contact night-coverage  07/03/2023, 11:01 AM

## 2023-07-03 NOTE — Hospital Course (Addendum)
 Brief Narrative:   28 year old with history of asthma, depression, marijuana use comes to the ED with complaints of hemoptysis, left upper quadrant abdominal pain and syncope.  Upon admission noted to be tachycardic and hypotensive.  CT suggestive of multifocal pneumonia, started on Rocephin and azithromycin.  Assessment & Plan:  Principal Problem:   Multifocal pneumonia Active Problems:   Severe sepsis (HCC)   Syncope   Chest pain   Hyponatremia   Metabolic acidosis   Serum total bilirubin elevated     Severe sepsis secondary to multifocal pneumonia -Concerns of multifocal pneumonia on the chest x-ray.  Sepsis physiology overall stabilizing slowly -COVID/RSV/flu-negative.  RSV P-pending -Strep pneumo and Legionella antigen- - Empiric Rocephin and azithromycin - Bronchodilators, I-S/flutter valve   Syncope and fall -Suspect from dehydration.  CT head and cervical spine negative - Getting IV fluids - Echocardiogram.   Left sided chest pain, abdominal left upper quadrant pain/left-sided flank pain EKG showing sinus tachycardia.  Troponins are flat.   Mild hyponatremia Dehydration and metabolic acidosis IV fluids   Mild elevation of total bilirubin Likely in the setting of acute infection.  Will continue to monitor   Asthma No acute exacerbation noted.    DVT prophylaxis: SCDs Start: 07/02/23 2003    Code Status: Full Code Family Communication:   Status is: Inpatient Remains inpatient appropriate because: Still having diminished breath sounds.  Continue hospital stay for at least next 24-48 hours    Subjective: Overall still feel weak   Examination:  General exam: Appears calm and comfortable  Respiratory system: Bilateral diminished breath sounds Cardiovascular system: S1 & S2 heard, RRR. No JVD, murmurs, rubs, gallops or clicks. No pedal edema. Gastrointestinal system: Abdomen is nondistended, soft and nontender. No organomegaly or masses felt. Normal bowel  sounds heard. Central nervous system: Alert and oriented. No focal neurological deficits. Extremities: Symmetric 5 x 5 power. Skin: No rashes, lesions or ulcers Psychiatry: Judgement and insight appear normal. Mood & affect appropriate.

## 2023-07-04 ENCOUNTER — Other Ambulatory Visit (HOSPITAL_COMMUNITY): Payer: Self-pay

## 2023-07-04 DIAGNOSIS — J189 Pneumonia, unspecified organism: Secondary | ICD-10-CM | POA: Diagnosis not present

## 2023-07-04 LAB — GLUCOSE, CAPILLARY: Glucose-Capillary: 85 mg/dL (ref 70–99)

## 2023-07-04 LAB — COMPREHENSIVE METABOLIC PANEL
ALT: 21 U/L (ref 0–44)
AST: 13 U/L — ABNORMAL LOW (ref 15–41)
Albumin: 2.6 g/dL — ABNORMAL LOW (ref 3.5–5.0)
Alkaline Phosphatase: 79 U/L (ref 38–126)
Anion gap: 8 (ref 5–15)
BUN: 11 mg/dL (ref 6–20)
CO2: 19 mmol/L — ABNORMAL LOW (ref 22–32)
Calcium: 8.6 mg/dL — ABNORMAL LOW (ref 8.9–10.3)
Chloride: 106 mmol/L (ref 98–111)
Creatinine, Ser: 0.65 mg/dL (ref 0.44–1.00)
GFR, Estimated: >60 mL/min (ref >60)
Glucose, Bld: 83 mg/dL (ref 70–99)
Potassium: 3.5 mmol/L (ref 3.5–5.1)
Sodium: 133 mmol/L — ABNORMAL LOW (ref 135–145)
Total Bilirubin: 0.8 mg/dL (ref 0.0–1.2)
Total Protein: 5.5 g/dL — ABNORMAL LOW (ref 6.5–8.1)

## 2023-07-04 LAB — PHOSPHORUS: Phosphorus: 2.6 mg/dL (ref 2.5–4.6)

## 2023-07-04 LAB — MAGNESIUM: Magnesium: 1.9 mg/dL (ref 1.7–2.4)

## 2023-07-04 LAB — CBC
HCT: 32.7 % — ABNORMAL LOW (ref 36.0–46.0)
Hemoglobin: 10.3 g/dL — ABNORMAL LOW (ref 12.0–15.0)
MCH: 27.5 pg (ref 26.0–34.0)
MCHC: 31.5 g/dL (ref 30.0–36.0)
MCV: 87.4 fL (ref 80.0–100.0)
Platelets: 246 10*3/uL (ref 150–400)
RBC: 3.74 MIL/uL — ABNORMAL LOW (ref 3.87–5.11)
RDW: 14.6 % (ref 11.5–15.5)
WBC: 13.7 10*3/uL — ABNORMAL HIGH (ref 4.0–10.5)
nRBC: 0 % (ref 0.0–0.2)

## 2023-07-04 MED ORDER — SUMATRIPTAN SUCCINATE 50 MG PO TABS
50.0000 mg | ORAL_TABLET | Freq: Once | ORAL | Status: AC
  Administered 2023-07-04: 50 mg via ORAL
  Filled 2023-07-04 (×2): qty 1

## 2023-07-04 MED ORDER — AZITHROMYCIN 500 MG PO TABS: 500.0000 mg | ORAL_TABLET | Freq: Every day | ORAL | Status: DC

## 2023-07-04 MED ORDER — AZITHROMYCIN 500 MG PO TABS
500.0000 mg | ORAL_TABLET | Freq: Every day | ORAL | 0 refills | Status: AC
  Filled 2023-07-04: qty 2, 2d supply, fill #0

## 2023-07-04 MED ORDER — SODIUM CHLORIDE 0.9 % IV SOLN
500.0000 mg | INTRAVENOUS | Status: DC
  Filled 2023-07-04: qty 5

## 2023-07-04 MED ORDER — CEPHALEXIN 250 MG PO CAPS
750.0000 mg | ORAL_CAPSULE | Freq: Four times a day (QID) | ORAL | 0 refills | Status: AC
  Filled 2023-07-04: qty 48, 4d supply, fill #0

## 2023-07-04 NOTE — Discharge Summary (Signed)
 Physician Discharge Summary  Megan Barnes:[id number] DOB: [birth year] DOA: 07/02/2023  PCP: Pcp, No  Admit date: 07/02/2023 Discharge date: 07/04/2023  Admitted From: Home Disposition: Home  Recommendations for Outpatient Follow-up:  Follow up with PCP in 1-2 weeks Please obtain BMP/CBC in one week your next doctors visit.  2 days of p.o. azithromycin, 4 days of p.o. Keflex Advised oral hydration at home. Continue using home bronchodilators    Discharge Condition: Stable CODE STATUS: Full code Diet recommendation: Regular  Brief/Interim Summary: Brief Narrative:   28 year old with history of asthma, depression, marijuana use comes to the ED with complaints of hemoptysis, left upper quadrant abdominal pain and syncope.  Upon admission noted to be tachycardic and hypotensive.  CT suggestive of multifocal pneumonia, started on Rocephin and azithromycin.  Due to concerns of syncope, she also had echocardiogram and IV fluid.  Echocardiogram was unremarkable.  CT head and cervical spine were negative.  [name] she started feeling significantly well.  Today she is no longer orthostatic and ambulating without any signs of hypoxia or shortness of breath.  Will discharge her in stable condition with outpatient follow-up.  Assessment & Plan:  Principal Problem:   Multifocal pneumonia Active Problems:   Severe sepsis (HCC)   Syncope   Chest pain   Hyponatremia   Metabolic acidosis   Serum total bilirubin elevated     Severe sepsis secondary to multifocal pneumonia -Concerns of multifocal pneumonia on the chest x-ray.  Sepsis physiology overall stabilizing slowly.  BNP normal, procalcitonin elevated.  Ambulating in the hallway without any shortness of breath or hypoxia.  Feeling better -COVID/RSV/flu-negative.  RSV P-negative -Strep pneumo and Legionella antigen-negative - Empiric Rocephin and azithromycin, will transition to p.o. Keflex and azithromycin -  Bronchodilators, I-S/flutter valve -Will get ambulatory pulse ox-no signs of desaturation   Syncope and fall, resolved -Suspect from dehydration.  CT head and cervical spine negative, no longer orthostatic this morning - Orthostatic Negative - Echocardiogram-preserved EF   Left sided chest pain, abdominal left upper quadrant pain/left-sided flank pain EKG showing sinus tachycardia.  Troponins are flat.   Mild hyponatremia Dehydration and metabolic acidosis IV fluids   Mild elevation of total bilirubin Likely in the setting of acute infection.  Will continue to monitor   Asthma No acute exacerbation noted.    DVT prophylaxis: SCDs Start: 07/02/23 2003    Code Status: Full Code Family Communication:   Status is: Inpatient Discharge today   Subjective: Feeling well.  Wanting to go home Examination:  General exam: Appears calm and comfortable  Respiratory system: Bilateral diminished breath sounds Cardiovascular system: S1 & S2 heard, RRR. No JVD, murmurs, rubs, gallops or clicks. No pedal edema. Gastrointestinal system: Abdomen is nondistended, soft and nontender. No organomegaly or masses felt. Normal bowel sounds heard. Central nervous system: Alert and oriented. No focal neurological deficits. Extremities: Symmetric 5 x 5 power. Skin: No rashes, lesions or ulcers Psychiatry: Judgement and insight appear normal. Mood & affect appropriate.    Discharge Diagnoses:  Principal Problem:   Multifocal pneumonia Active Problems:   Severe sepsis [facility])   Syncope   Chest pain   Hyponatremia   Metabolic acidosis   Serum total bilirubin elevated      Discharge Exam: Vitals:   07/04/23 0939 07/04/23 0941  BP: (!) 110/57 (!) 123/58  Pulse:    Resp:    Temp:    SpO2:     Vitals:   07/04/23 0528 07/04/23 9562 07/04/23 1308  07/04/23 0941  BP: (!) 108/57 (!) 118/56 (!) 110/57 (!) 123/58  Pulse: 85     Resp: 16     Temp: 98.3 F (36.8 C)     TempSrc: Oral      SpO2: 96%     Weight:          Discharge Instructions   Allergies as of 07/04/2023       Reactions   Penicillins Hives        Medication List     STOP taking these medications    predniSONE 10 MG tablet Commonly known as: DELTASONE       TAKE these medications    albuterol 108 (90 Base) MCG/ACT inhaler Commonly known as: VENTOLIN HFA Inhale 2 puffs into the lungs every 6 (six) hours as needed for wheezing or shortness of breath.   albuterol (2.5 MG/3ML) 0.083% nebulizer solution Commonly known as: PROVENTIL Take 3 mLs (2.5 mg total) by nebulization every 4 (four) hours as needed for wheezing or shortness of breath.   azithromycin 500 MG tablet Commonly known as: ZITHROMAX Take 1 tablet (500 mg total) by mouth daily for 2 days. Start taking on: July 05, 2023   budesonide-formoterol 80-4.5 MCG/ACT inhaler Commonly known as: Symbicort Inhale 2 puffs into the lungs daily. What changed: when to take this   cephALEXin 750 MG capsule Commonly known as: Keflex Take 1 capsule (750 mg total) by mouth 4 (four) times daily for 4 days.   fexofenadine 180 MG tablet Commonly known as: ALLEGRA Take 180 mg by mouth daily.   ibuprofen 200 MG tablet Commonly known as: ADVIL Take 200 mg by mouth every 6 (six) hours as needed for moderate pain (pain score 4-6).        Allergies  Allergen Reactions   Penicillins Hives    You were cared for by a hospitalist during your hospital stay. If you have any questions about your discharge medications or the care you received while you were in the hospital after you are discharged, you can call the unit and asked to speak with the hospitalist on call if the hospitalist that took care of you is not available. Once you are discharged, your primary care physician will handle any further medical issues. Please note that no refills for any discharge medications will be authorized once you are discharged, as it is imperative that you  return to your primary care physician (or establish a relationship with a primary care physician if you do not have one) for your aftercare needs so that they can reassess your need for medications and monitor your lab values.  You were cared for by a hospitalist during your hospital stay. If you have any questions about your discharge medications or the care you received while you were in the hospital after you are discharged, you can call the unit and asked to speak with the hospitalist on call if the hospitalist that took care of you is not available. Once you are discharged, your primary care physician will handle any further medical issues. Please note that NO REFILLS for any discharge medications will be authorized once you are discharged, as it is imperative that you return to your primary care physician (or establish a relationship with a primary care physician if you do not have one) for your aftercare needs so that they can reassess your need for medications and monitor your lab values.  Please request your Prim.MD to go over all Hospital Tests and Procedure/Radiological results  at the follow up, please get all Hospital records sent to your Prim MD by signing hospital release before you go home.  Get CBC, CMP, 2 view Chest X ray checked  by Primary MD during your next visit or SNF MD in 5-7 days ( we routinely change or add medications that can affect your baseline labs and fluid status, therefore we recommend that you get the mentioned basic workup next visit with your PCP, your PCP may decide not to get them or add new tests based on their clinical decision)  On your next visit with your primary care physician please Get Medicines reviewed and adjusted.  If you experience worsening of your admission symptoms, develop shortness of breath, life threatening emergency, suicidal or homicidal thoughts you must seek medical attention immediately by calling 911 or calling your MD immediately  if  symptoms less severe.  You Must read complete instructions/literature along with all the possible adverse reactions/side effects for all the Medicines you take and that have been prescribed to you. Take any new Medicines after you have completely understood and accpet all the possible adverse reactions/side effects.   Do not drive, operate heavy machinery, perform activities at heights, swimming or participation in water activities or provide baby sitting services if your were admitted for syncope or siezures until you have seen by Primary MD or a Neurologist and advised to do so again.  Do not drive when taking Pain medications.   Procedures/Studies: ECHOCARDIOGRAM COMPLETE Result Date: 07/03/2023    ECHOCARDIOGRAM REPORT   Patient Name:   Megan Barnes Date of Exam: 07/03/2023 Medical Rec #:  [id number]        Height:       68.0 in Accession #:    [id number]       Weight:       210.5 lb Date of Birth:  [birth year]        BSA:          2.089 m Patient Age:    27 years         BP:           105/48 mmHg Patient Gender: F                HR:           101 bpm. Exam Location:  Inpatient Procedure: 2D Echo, Cardiac Doppler and Color Doppler (Both Spectral and Color            Flow Doppler were utilized during procedure). Indications:    Syncope  History:        Patient has no prior history of Echocardiogram examinations.  Sonographer:    Darlys Gales Referring Phys: [id number] VASUNDHRA RATHORE IMPRESSIONS  1. Left ventricular ejection fraction, by estimation, is 55 to 60%. The left ventricle has normal function. The left ventricle has no regional wall motion abnormalities. Left ventricular diastolic parameters were normal.  2. Right ventricular systolic function is normal. The right ventricular size is normal.  3. The mitral valve is normal in structure. Trivial mitral valve regurgitation. No evidence of mitral stenosis.  4. The aortic valve is normal in structure. Aortic valve regurgitation is not visualized. No  aortic stenosis is present.  5. The inferior vena cava is normal in size with greater than 50% respiratory variability, suggesting right atrial pressure of 3 mmHg. FINDINGS  Left Ventricle: Left ventricular ejection fraction, by estimation, is 55 to 60%. The left ventricle has normal function. The left ventricle  has no regional wall motion abnormalities. The left ventricular internal cavity size was normal in size. There is  no left ventricular hypertrophy. Left ventricular diastolic parameters were normal. Right Ventricle: The right ventricular size is normal. No increase in right ventricular wall thickness. Right ventricular systolic function is normal. Left Atrium: Left atrial size was normal in size. Right Atrium: Right atrial size was normal in size. Pericardium: There is no evidence of pericardial effusion. Mitral Valve: The mitral valve is normal in structure. Trivial mitral valve regurgitation. No evidence of mitral valve stenosis. Tricuspid Valve: The tricuspid valve is normal in structure. Tricuspid valve regurgitation is trivial. No evidence of tricuspid stenosis. Aortic Valve: The aortic valve is normal in structure. Aortic valve regurgitation is not visualized. No aortic stenosis is present. Aortic valve mean gradient measures 4.0 mmHg. Aortic valve peak gradient measures 8.8 mmHg. Aortic valve area, by VTI measures 2.39 cm. Pulmonic Valve: The pulmonic valve was normal in structure. Pulmonic valve regurgitation is not visualized. No evidence of pulmonic stenosis. Aorta: The aortic root is normal in size and structure. Venous: The inferior vena cava is normal in size with greater than 50% respiratory variability, suggesting right atrial pressure of 3 mmHg. IAS/Shunts: No atrial level shunt detected by color flow Doppler.  LEFT VENTRICLE PLAX 2D LVIDd:         5.80 cm   Diastology LVIDs:         4.10 cm   LV e' medial:    7.51 cm/s LV PW:         0.70 cm   LV E/e' medial:  11.5 LV IVS:        0.90 cm    LV e' lateral:   7.62 cm/s LVOT diam:     1.80 cm   LV E/e' lateral: 11.4 LV SV:         55 LV SV Index:   27 LVOT Area:     2.54 cm  RIGHT VENTRICLE RV S prime:     15.40 cm/s TAPSE (M-mode): 3.0 cm LEFT ATRIUM             Index        RIGHT ATRIUM           Index LA Vol (A2C):   45.3 ml 21.69 ml/m  RA Area:     17.50 cm LA Vol (A4C):   38.3 ml 18.34 ml/m  RA Volume:   46.40 ml  22.21 ml/m LA Biplane Vol: 44.0 ml 21.06 ml/m  AORTIC VALVE AV Area (Vmax):    2.27 cm AV Area (Vmean):   2.47 cm AV Area (VTI):     2.39 cm AV Vmax:           148.00 cm/s AV Vmean:          99.100 cm/s AV VTI:            0.232 m AV Peak Grad:      8.8 mmHg AV Mean Grad:      4.0 mmHg LVOT Vmax:         132.00 cm/s LVOT Vmean:        96.100 cm/s LVOT VTI:          0.218 m LVOT/AV VTI ratio: 0.94  AORTA Ao Root diam: 2.50 cm MITRAL VALVE MV Area (PHT): 5.23 cm    SHUNTS MV Decel Time: 145 msec    Systemic VTI:  0.22 m MV E velocity: 86.50 cm/s  Systemic Diam:  1.80 cm MV A velocity: 75.40 cm/s MV E/A ratio:  1.15 Arvilla Meres MD Electronically signed by Arvilla Meres MD Signature Date/Time: 07/03/2023/1:29:17 PM    Final    CT CERVICAL SPINE WO CONTRAST Result Date: 07/02/2023 CLINICAL DATA:  Left-sided neck pain, initial encounter EXAM: CT CERVICAL SPINE WITHOUT CONTRAST TECHNIQUE: Multidetector CT imaging of the cervical spine was performed without intravenous contrast. Multiplanar CT image reconstructions were also generated. RADIATION DOSE REDUCTION: This exam was performed according to the departmental dose-optimization program which includes automated exposure control, adjustment of the mA and/or kV according to patient size and/or use of iterative reconstruction technique. COMPARISON:  02/15/2019 FINDINGS: Alignment: Mild loss of the normal cervical lordosis is noted. Skull base and vertebrae: 7 cervical segments are well visualized. Vertebral body height is well maintained. No acute fracture or acute facet abnormality  is noted. The odontoid is within normal limits. Soft tissues and spinal canal: Surrounding soft tissue structures are within normal limits. Upper chest: Visualized lung apices are unremarkable. Other: None IMPRESSION: No acute abnormality noted. Electronically Signed   By: Alcide Clever M.D.   On: 07/02/2023 21:42   CT HEAD WO CONTRAST ( ) Result Date: 07/02/2023 CLINICAL DATA:  Initial evaluation for acute head trauma. EXAM: CT HEAD WITHOUT CONTRAST TECHNIQUE: Contiguous axial images were obtained from the base of the skull through the vertex without intravenous contrast. RADIATION DOSE REDUCTION: This exam was performed according to the departmental dose-optimization program which includes automated exposure control, adjustment of the mA and/or kV according to patient size and/or use of iterative reconstruction technique. COMPARISON:  None Available. FINDINGS: Brain: Cerebral volume within normal limits. No acute intracranial hemorrhage. No acute large vessel territory infarct. No mass lesion, midline shift or mass effect. No hydrocephalus or extra-axial fluid collection. Vascular: No abnormal hyperdense vessel. Skull: Scalp soft tissues within normal limits.  Calvarium intact. Sinuses/Orbits: Globes orbital soft tissues within normal limits. Scattered mucosal thickening noted about the ethmoidal air cells. Small volume pneumatized secretions noted within the right sphenoid sinus. Mastoid air cells are clear. Other: None. IMPRESSION: 1. Normal head CT.  No acute intracranial abnormality. 2. Mild sphenoethmoidal sinus disease. Electronically Signed   By: Rise Mu M.D.   On: 07/02/2023 21:41   DG Chest 2 View Result Date: 07/02/2023 CLINICAL DATA:  Short of breath, hemoptysis EXAM: CHEST - 2 VIEW COMPARISON:  03/12/2022 FINDINGS: Frontal and lateral views of the chest demonstrate an unremarkable cardiac silhouette. Nodular areas of consolidation are seen in the right lower lobe, left upper lobe,  left lower lobe. There is also dense consolidation within the left lower lobe retrocardiac region. Small left effusion. No pneumothorax. No acute bony abnormalities. IMPRESSION: 1. Multifocal areas of consolidation at the lung bases, most pronounced within the left lower lobe. Findings are consistent with bilateral pneumonia in the appropriate clinical setting, and follow-up PA and lateral chest X-ray is recommended in 3-4 weeks following trial of antibiotic therapy to ensure resolution. 2. Small left pleural effusion. Electronically Signed   By: Sharlet Salina M.D.   On: 07/02/2023 12:55     The results of significant diagnostics from this hospitalization (including imaging, microbiology, ancillary and laboratory) are listed below for reference.     Microbiology: Recent Results (from the past 240 hours)  Resp panel by RT-PCR (RSV, Flu A&B, Covid) Anterior Nasal Swab     Status: None   Collection Time: 07/02/23 10:36 AM   Specimen: Anterior Nasal Swab  Result Value Ref Range Status  SARS Coronavirus 2 by RT PCR NEGATIVE NEGATIVE Final    Comment: (NOTE) SARS-CoV-2 target nucleic acids are NOT DETECTED.  The SARS-CoV-2 RNA is generally detectable in upper respiratory specimens during the acute phase of infection. The lowest concentration of SARS-CoV-2 viral copies this assay can detect is 138 copies/mL. A negative result does not preclude SARS-Cov-2 infection and should not be used as the sole basis for treatment or other patient management decisions. A negative result may occur with  improper specimen collection/handling, submission of specimen other than nasopharyngeal swab, presence of viral mutation(s) within the areas targeted by this assay, and inadequate number of viral copies(<138 copies/mL). A negative result must be combined with clinical observations, patient history, and epidemiological information. The expected result is Negative.  Fact Sheet for Patients:   BloggerCourse.com  Fact Sheet for Healthcare Providers:  SeriousBroker.it  This test is no t yet approved or cleared by the [place] FDA and  has been authorized for detection and/or diagnosis of SARS-CoV-2 by FDA under an Emergency Use Authorization (EUA). This EUA will remain  in effect (meaning this test can be used) for the duration of the COVID-19 declaration under Section 564(b)(1) of the Act, 21 U.S.C.section 360bbb-3(b)(1), unless the authorization is terminated  or revoked sooner.       Influenza A by PCR NEGATIVE NEGATIVE Final   Influenza B by PCR NEGATIVE NEGATIVE Final    Comment: (NOTE) The Xpert Xpress SARS-CoV-2/FLU/RSV plus assay is intended as an aid in the diagnosis of influenza from Nasopharyngeal swab specimens and should not be used as a sole basis for treatment. Nasal washings and aspirates are unacceptable for Xpert Xpress SARS-CoV-2/FLU/RSV testing.  Fact Sheet for Patients: BloggerCourse.com  Fact Sheet for Healthcare Providers: SeriousBroker.it  This test is not yet approved or cleared by the [place] FDA and has been authorized for detection and/or diagnosis of SARS-CoV-2 by FDA under an Emergency Use Authorization (EUA). This EUA will remain in effect (meaning this test can be used) for the duration of the COVID-19 declaration under Section 564(b)(1) of the Act, 21 U.S.C. section 360bbb-3(b)(1), unless the authorization is terminated or revoked.     Resp Syncytial Virus by PCR NEGATIVE NEGATIVE Final    Comment: (NOTE) Fact Sheet for Patients: BloggerCourse.com  Fact Sheet for Healthcare Providers: SeriousBroker.it  This test is not yet approved or cleared by the [place] FDA and has been authorized for detection and/or diagnosis of SARS-CoV-2 by FDA under an Emergency Use  Authorization (EUA). This EUA will remain in effect (meaning this test can be used) for the duration of the COVID-19 declaration under Section 564(b)(1) of the Act, 21 U.S.C. section 360bbb-3(b)(1), unless the authorization is terminated or revoked.  Performed at [facility], [address] [place], [place] [address]   Blood culture (routine x 2)     Status: None (Preliminary result)   Collection Time: 07/02/23  1:45 PM   Specimen: BLOOD  Result Value Ref Range Status   Specimen Description   Final    BLOOD LEFT ANTECUBITAL Performed at [facility], [address] [place], [place] [address]    Special Requests   Final    BOTTLES DRAWN AEROBIC AND ANAEROBIC Blood Culture results may not be optimal due to an inadequate volume of blood received in culture bottles Performed at [facility], [address] [place], [place] [address]    Culture   Final    NO  GROWTH 2 DAYS Performed at [facility], [address], [place], [place] [address]    Report Status PENDING  Incomplete  Culture, blood (Routine X 2) w Reflex to ID Panel     Status: None (Preliminary result)   Collection Time: 07/02/23  7:40 PM   Specimen: BLOOD RIGHT HAND  Result Value Ref Range Status   Specimen Description   Final    BLOOD RIGHT HAND Performed at [facility], [address], [place], [place] [address]    Special Requests   Final    BOTTLES DRAWN AEROBIC ONLY Blood Culture results may not be optimal due to an inadequate volume of blood received in culture bottles Performed at [facility], [address], [place], [place] [address]    Culture   Final    NO GROWTH 1 DAY Performed at [facility], [address] [place], [place] [address]    Report Status PENDING  Incomplete  Respiratory (~20 pathogens) panel by PCR     Status: None   Collection Time: 07/03/23 12:35 AM   Specimen: Nasopharyngeal Swab; Respiratory  Result Value  Ref Range Status   Adenovirus NOT DETECTED NOT DETECTED Final   Coronavirus 229E NOT DETECTED NOT DETECTED Final    Comment: (NOTE) The Coronavirus on the Respiratory Panel, DOES NOT test for the novel  Coronavirus (2019 nCoV)    Coronavirus HKU1 NOT DETECTED NOT DETECTED Final   Coronavirus NL63 NOT DETECTED NOT DETECTED Final   Coronavirus OC43 NOT DETECTED NOT DETECTED Final   Metapneumovirus NOT DETECTED NOT DETECTED Final   Rhinovirus / Enterovirus NOT DETECTED NOT DETECTED Final   Influenza A NOT DETECTED NOT DETECTED Final   Influenza B NOT DETECTED NOT DETECTED Final   Parainfluenza Virus 1 NOT DETECTED NOT DETECTED Final   Parainfluenza Virus 2 NOT DETECTED NOT DETECTED Final   Parainfluenza Virus 3 NOT DETECTED NOT DETECTED Final   Parainfluenza Virus 4 NOT DETECTED NOT DETECTED Final   Respiratory Syncytial Virus NOT DETECTED NOT DETECTED Final   Bordetella pertussis NOT DETECTED NOT DETECTED Final   Bordetella Parapertussis NOT DETECTED NOT DETECTED Final   Chlamydophila pneumoniae NOT DETECTED NOT DETECTED Final   Mycoplasma pneumoniae NOT DETECTED NOT DETECTED Final    Comment: Performed at [facility], [address], [place], [place] [address]  Expectorated Sputum Assessment w Gram Stain, Rflx to Resp Cult     Status: None   Collection Time: 07/03/23  7:25 AM   Specimen: Expectorated Sputum  Result Value Ref Range Status   Specimen Description EXPECTORATED SPUTUM  Final   Special Requests NONE  Final   Sputum evaluation   Final    Sputum specimen not acceptable for testing.  Please recollect.   INFORMED K.Harris,RN to recollect on 07/03/2023 at 0856 am by SL Performed at [facility], [address] [place], [place] [address]    Report Status 07/03/2023 FINAL  Final  Expectorated Sputum Assessment w Gram Stain, Rflx to Resp Cult     Status: None   Collection Time: 07/03/23  7:41 PM  Result Value Ref Range Status   Specimen Description  EXPECTORATED SPUTUM  Final   Special Requests NONE  Final   Sputum evaluation   Final    Sputum specimen not acceptable for testing.  Please recollect.   NOTIFIED C. NJA,RN 06/05/23 2114 BY K. DAVIS Performed at [facility], [address] [place],  [place] [address]    Report Status 07/03/2023 FINAL  Final     Labs: BNP (last 3 results) Recent Labs    07/03/23 1032  BNP 90.3   Basic Metabolic Panel: Recent Labs  Lab 07/02/23 1118 07/02/23 2339 07/04/23 0532  NA 130* 132* 133*  K 3.5 3.3* 3.5  CL 101 103 106  CO2 17* 17* 19*  GLUCOSE 111* 108* 83  BUN 14 13 11   CREATININE 0.82 0.91 0.65  CALCIUM 8.8* 8.2* 8.6*  MG  --   --  1.9  PHOS  --   --  2.6   Liver Function Tests: Recent Labs  Lab 07/02/23 1118 07/02/23 2339 07/04/23 0532  AST 27 22 13*  ALT 44 32 21  ALKPHOS 72 69 79  BILITOT 2.3* 2.1* 0.8  PROT 6.3* 6.1* 5.5*  ALBUMIN 3.3* 3.0* 2.6*   Recent Labs  Lab 07/02/23 1118  LIPASE 22   No results for input(s): "AMMONIA" in the last 168 hours. CBC: Recent Labs  Lab 07/02/23 1118 07/02/23 2339 07/04/23 0532  WBC 12.9* 12.4* 13.7*  NEUTROABS 11.3*  --   --   HGB 12.5 11.8* 10.3*  HCT 38.4 38.3 32.7*  MCV 83.1 87.6 87.4  PLT 261 250 246   Cardiac Enzymes: No results for input(s): "CKTOTAL", "CKMB", "CKMBINDEX", "TROPONINI" in the last 168 hours. BNP: Invalid input(s): "POCBNP" CBG: Recent Labs  Lab 07/02/23 2136 07/03/23 1149 07/03/23 1807 07/03/23 2029 07/04/23 0748  GLUCAP 95 301* 84 85 85   D-Dimer No results for input(s): "DDIMER" in the last 72 hours. Hgb A1c Recent Labs    07/03/23 1624  HGBA1C 5.4   Lipid Profile No results for input(s): "CHOL", "HDL", "LDLCALC", "TRIG", "CHOLHDL", "LDLDIRECT" in the last 72 hours. Thyroid function studies No results for input(s): "TSH", "T4TOTAL", "T3FREE", "THYROIDAB" in the last 72 hours.  Invalid input(s): "FREET3" Anemia work up No results for input(s):  "VITAMINB12", "FOLATE", "FERRITIN", "TIBC", "IRON", "RETICCTPCT" in the last 72 hours. Urinalysis    Component Value Date/Time   COLORURINE YELLOW 07/02/2023 1256   APPEARANCEUR CLEAR 07/02/2023 1256   APPEARANCEUR Hazy 01/24/2013 2221   LABSPEC 1.015 07/02/2023 1256   LABSPEC 1.018 01/24/2013 2221   PHURINE 6.5 07/02/2023 1256   GLUCOSEU NEGATIVE 07/02/2023 1256   GLUCOSEU Negative 01/24/2013 2221   HGBUR NEGATIVE 07/02/2023 1256   BILIRUBINUR NEGATIVE 07/02/2023 1256   BILIRUBINUR Negative 01/24/2013 2221   KETONESUR NEGATIVE 07/02/2023 1256   PROTEINUR 30 (A) 07/02/2023 1256   NITRITE NEGATIVE 07/02/2023 1256   LEUKOCYTESUR NEGATIVE 07/02/2023 1256   LEUKOCYTESUR Negative 01/24/2013 2221   Sepsis Labs Recent Labs  Lab 07/02/23 1118 07/02/23 2339 07/04/23 0532  WBC 12.9* 12.4* 13.7*   Microbiology Recent Results (from the past 240 hours)  Resp panel by RT-PCR (RSV, Flu A&B, Covid) Anterior Nasal Swab     Status: None   Collection Time: 07/02/23 10:36 AM   Specimen: Anterior Nasal Swab  Result Value Ref Range Status   SARS Coronavirus 2 by RT PCR NEGATIVE NEGATIVE Final    Comment: (NOTE) SARS-CoV-2 target nucleic acids are NOT DETECTED.  The SARS-CoV-2 RNA is generally detectable in upper respiratory specimens during the acute phase of infection. The lowest concentration of SARS-CoV-2 viral copies this assay can detect is 138 copies/mL. A negative result does not preclude SARS-Cov-2 infection and should not be used as the sole basis for treatment or other patient management decisions. A negative result may occur with  improper specimen collection/handling,  submission of specimen other than nasopharyngeal swab, presence of viral mutation(s) within the areas targeted by this assay, and inadequate number of viral copies(<138 copies/mL). A negative result must be combined with clinical observations, patient history, and epidemiological information. The expected result  is Negative.  Fact Sheet for Patients:  BloggerCourse.com  Fact Sheet for Healthcare Providers:  SeriousBroker.it  This test is no t yet approved or cleared by the [place] FDA and  has been authorized for detection and/or diagnosis of SARS-CoV-2 by FDA under an Emergency Use Authorization (EUA). This EUA will remain  in effect (meaning this test can be used) for the duration of the COVID-19 declaration under Section 564(b)(1) of the Act, 21 U.S.C.section 360bbb-3(b)(1), unless the authorization is terminated  or revoked sooner.       Influenza A by PCR NEGATIVE NEGATIVE Final   Influenza B by PCR NEGATIVE NEGATIVE Final    Comment: (NOTE) The Xpert Xpress SARS-CoV-2/FLU/RSV plus assay is intended as an aid in the diagnosis of influenza from Nasopharyngeal swab specimens and should not be used as a sole basis for treatment. Nasal washings and aspirates are unacceptable for Xpert Xpress SARS-CoV-2/FLU/RSV testing.  Fact Sheet for Patients: BloggerCourse.com  Fact Sheet for Healthcare Providers: SeriousBroker.it  This test is not yet approved or cleared by the [place] FDA and has been authorized for detection and/or diagnosis of SARS-CoV-2 by FDA under an Emergency Use Authorization (EUA). This EUA will remain in effect (meaning this test can be used) for the duration of the COVID-19 declaration under Section 564(b)(1) of the Act, 21 U.S.C. section 360bbb-3(b)(1), unless the authorization is terminated or revoked.     Resp Syncytial Virus by PCR NEGATIVE NEGATIVE Final    Comment: (NOTE) Fact Sheet for Patients: BloggerCourse.com  Fact Sheet for Healthcare Providers: SeriousBroker.it  This test is not yet approved or cleared by the [place] FDA and has been authorized for detection and/or diagnosis of  SARS-CoV-2 by FDA under an Emergency Use Authorization (EUA). This EUA will remain in effect (meaning this test can be used) for the duration of the COVID-19 declaration under Section 564(b)(1) of the Act, 21 U.S.C. section 360bbb-3(b)(1), unless the authorization is terminated or revoked.  Performed at [facility], [address] [place], [place] [address]   Blood culture (routine x 2)     Status: None (Preliminary result)   Collection Time: 07/02/23  1:45 PM   Specimen: BLOOD  Result Value Ref Range Status   Specimen Description   Final    BLOOD LEFT ANTECUBITAL Performed at [facility], [address] [place], [place] [address]    Special Requests   Final    BOTTLES DRAWN AEROBIC AND ANAEROBIC Blood Culture results may not be optimal due to an inadequate volume of blood received in culture bottles Performed at [facility], [address] [place], [place] [address]    Culture   Final    NO GROWTH 2 DAYS Performed at [facility], [address] [place], [place] [address]    Report Status PENDING  Incomplete  Culture, blood (Routine X 2) w Reflex to ID Panel     Status: None (Preliminary result)   Collection Time: 07/02/23  7:40 PM   Specimen: BLOOD RIGHT HAND  Result Value Ref Range Status   Specimen Description   Final    BLOOD RIGHT HAND Performed at [facility], [address],  [place], [place] [address]    Special Requests   Final    BOTTLES DRAWN AEROBIC ONLY Blood Culture results may not be optimal due to an inadequate volume of blood received in culture bottles Performed at [facility], [address], [place], [place] [address]    Culture   Final    NO GROWTH 1 DAY Performed at [facility], [address] [place], [place] [address]    Report Status PENDING  Incomplete  Respiratory (~20 pathogens) panel by PCR     Status: None   Collection Time: 07/03/23 12:35 AM   Specimen:  Nasopharyngeal Swab; Respiratory  Result Value Ref Range Status   Adenovirus NOT DETECTED NOT DETECTED Final   Coronavirus 229E NOT DETECTED NOT DETECTED Final    Comment: (NOTE) The Coronavirus on the Respiratory Panel, DOES NOT test for the novel  Coronavirus (2019 nCoV)    Coronavirus HKU1 NOT DETECTED NOT DETECTED Final   Coronavirus NL63 NOT DETECTED NOT DETECTED Final   Coronavirus OC43 NOT DETECTED NOT DETECTED Final   Metapneumovirus NOT DETECTED NOT DETECTED Final   Rhinovirus / Enterovirus NOT DETECTED NOT DETECTED Final   Influenza A NOT DETECTED NOT DETECTED Final   Influenza B NOT DETECTED NOT DETECTED Final   Parainfluenza Virus 1 NOT DETECTED NOT DETECTED Final   Parainfluenza Virus 2 NOT DETECTED NOT DETECTED Final   Parainfluenza Virus 3 NOT DETECTED NOT DETECTED Final   Parainfluenza Virus 4 NOT DETECTED NOT DETECTED Final   Respiratory Syncytial Virus NOT DETECTED NOT DETECTED Final   Bordetella pertussis NOT DETECTED NOT DETECTED Final   Bordetella Parapertussis NOT DETECTED NOT DETECTED Final   Chlamydophila pneumoniae NOT DETECTED NOT DETECTED Final   Mycoplasma pneumoniae NOT DETECTED NOT DETECTED Final    Comment: Performed at [facility], [address], [place], [place] [address]  Expectorated Sputum Assessment w Gram Stain, Rflx to Resp Cult     Status: None   Collection Time: 07/03/23  7:25 AM   Specimen: Expectorated Sputum  Result Value Ref Range Status   Specimen Description EXPECTORATED SPUTUM  Final   Special Requests NONE  Final   Sputum evaluation   Final    Sputum specimen not acceptable for testing.  Please recollect.   INFORMED K.Harris,RN to recollect on 07/03/2023 at 0856 am by SL Performed at [facility], [address], [place], [place] [address]    Report Status 07/03/2023 FINAL  Final  Expectorated Sputum Assessment w Gram Stain, Rflx to Resp Cult     Status: None   Collection Time: 07/03/23  7:41 PM  Result  Value Ref Range Status   Specimen Description EXPECTORATED SPUTUM  Final   Special Requests NONE  Final   Sputum evaluation   Final    Sputum specimen not acceptable for testing.  Please recollect.   NOTIFIED C. NJA,RN 06/05/23 2114 BY K. DAVIS Performed at [facility], [address], [place], [place] [address]    Report Status 07/03/2023 FINAL  Final     Time coordinating discharge:  I have spent 35 minutes face to face with the patient and on the ward discussing the patients care, assessment, plan and disposition with other care givers. >50% of the time was devoted counseling the patient about the risks and benefits of treatment/Discharge disposition and coordinating care.   SIGNED:   Miguel Rota, MD  [facility] 07/04/2023, 11:47 AM   If 7PM-7AM, please  contact night-coverage

## 2023-07-04 NOTE — Progress Notes (Signed)
 AVS reviewed w/ pt who verbalize an understanding. Pt is to check w/ her health insurance to see  what PCP's are on her plan & to get an appt set up post hospital visit & to see if she needs a repeat chest xray in 4 - 6 weeks. No other questions at this time. TOC meds delivered in a secure bag to patient in room by this RN

## 2023-07-04 NOTE — TOC Transition Note (Signed)
 Transition of Care [facility]) - Discharge Note   Patient Details  Name: Megan Barnes MRN: [id number] Date of Birth: [birth year]  [facility] CM/SW Contact:  Lanier Clam, RN Phone Number: 07/04/2023, 12:14 PM   Clinical Narrative: d/c home no needs.      Final next level of care: Home/Self Care Barriers to Discharge: No Barriers Identified   Patient Goals and CMS Choice Patient states their goals for this hospitalization and ongoing recovery are:: Home CMS Medicare.gov Compare Post Acute Care list provided to:: Patient Choice offered to / list presented to : Patient [place] ownership interest in [facility].provided to:: Patient    Discharge Placement                       Discharge Plan and Services Additional resources added to the After Visit Summary for                                       Social Drivers of Health (SDOH) Interventions SDOH Screenings   Food Insecurity: Patient Declined (07/03/2023)  Housing: Unknown (07/03/2023)  Transportation Needs: Patient Declined (07/03/2023)  Utilities: Not At Risk (07/03/2023)  Tobacco Use: Low Risk  (07/02/2023)     Readmission Risk Interventions     No data to display

## 2023-07-04 NOTE — Progress Notes (Signed)
 PROGRESS NOTE    Megan Barnes  ZOX:[id number] DOB: October 11, 1995 DOA: 07/02/2023 PCP: Pcp, No    Brief Narrative:   28 year old with history of asthma, depression, marijuana use comes to the ED with complaints of hemoptysis, left upper quadrant abdominal pain and syncope.  Upon admission noted to be tachycardic and hypotensive.  CT suggestive of multifocal pneumonia, started on Rocephin and azithromycin.  Assessment & Plan:  Principal Problem:   Multifocal pneumonia Active Problems:   Severe sepsis (HCC)   Syncope   Chest pain   Hyponatremia   Metabolic acidosis   Serum total bilirubin elevated     Severe sepsis secondary to multifocal pneumonia -Concerns of multifocal pneumonia on the chest x-ray.  Sepsis physiology overall stabilizing slowly.  BNP normal, procalcitonin elevated. -COVID/RSV/flu-negative.  RSV P-negative -Strep pneumo and Legionella antigen-negative - Empiric Rocephin and azithromycin - Bronchodilators, I-S/flutter valve -Will get ambulatory pulse ox   Syncope and fall, resolved -Suspect from dehydration.  CT head and cervical spine negative, no longer orthostatic this morning - Getting IV fluids - Echocardiogram-preserved EF   Left sided chest pain, abdominal left upper quadrant pain/left-sided flank pain EKG showing sinus tachycardia.  Troponins are flat.   Mild hyponatremia Dehydration and metabolic acidosis IV fluids   Mild elevation of total bilirubin Likely in the setting of acute infection.  Will continue to monitor   Asthma No acute exacerbation noted.    DVT prophylaxis: SCDs Start: 07/02/23 2003    Code Status: Full Code Family Communication:   Status is: Inpatient Remains inpatient appropriate because: Will check orthostatics and ambulatory pulse ox    Subjective: Overall still feel weak but improved.  Tells me she has [place] migraine this morning.   Examination:  General exam: Appears calm and comfortable  Respiratory  system: Bilateral diminished breath sounds Cardiovascular system: S1 & S2 heard, RRR. No JVD, murmurs, rubs, gallops or clicks. No pedal edema. Gastrointestinal system: Abdomen is nondistended, soft and nontender. No organomegaly or masses felt. Normal bowel sounds heard. Central nervous system: Alert and oriented. No focal neurological deficits. Extremities: Symmetric 5 x 5 power. Skin: No rashes, lesions or ulcers Psychiatry: Judgement and insight appear normal. Mood & affect appropriate.                Diet Orders (From admission, onward)     Start     Ordered   07/02/23 2004  Diet regular Room service appropriate? Yes; Fluid consistency: Thin  Diet effective now       Question Answer Comment  Room service appropriate? Yes   Fluid consistency: Thin      07/02/23 2007            Objective: Vitals:   07/04/23 0528 07/04/23 0938 07/04/23 0939 07/04/23 0941  BP: (!) 108/57 (!) 118/56 (!) 110/57 (!) 123/58  Pulse: 85     Resp: 16     Temp: 98.3 F (36.8 C)     TempSrc: Oral     SpO2: 96%     Weight:        Intake/Output Summary (Last 24 hours) at 07/04/2023 1030 Last data filed at 07/03/2023 2100 Gross per 24 hour  Intake --  Output 800 ml  Net -800 ml   Filed Weights   07/02/23 1034  Weight: 95.5 kg    Scheduled Meds:  insulin aspart  0-5 Units Subcutaneous QHS   insulin aspart  0-9 Units Subcutaneous TID WC   mometasone-formoterol  2 puff Inhalation BID  SUMAtriptan  50 mg Oral Once   Continuous Infusions:  azithromycin 500 mg (07/03/23 1356)   cefTRIAXone (ROCEPHIN)  IV 1 g (07/03/23 1354)    Nutritional status     Body mass index is 32.01 kg/m.  Data Reviewed:   CBC: Recent Labs  Lab 07/02/23 1118 07/02/23 2339 07/04/23 0532  WBC 12.9* 12.4* 13.7*  NEUTROABS 11.3*  --   --   HGB 12.5 11.8* 10.3*  HCT 38.4 38.3 32.7*  MCV 83.1 87.6 87.4  PLT 261 250 246   Basic Metabolic Panel: Recent Labs  Lab 07/02/23 1118 07/02/23 2339  07/04/23 0532  NA 130* 132* 133*  K 3.5 3.3* 3.5  CL 101 103 106  CO2 17* 17* 19*  GLUCOSE 111* 108* 83  BUN 14 13 11   CREATININE 0.82 0.91 0.65  CALCIUM 8.8* 8.2* 8.6*  MG  --   --  1.9  PHOS  --   --  2.6   GFR: Estimated Creatinine Clearance: 128.7 mL/min (by C-G formula based on SCr of 0.65 mg/dL). Liver Function Tests: Recent Labs  Lab 07/02/23 1118 07/02/23 2339 07/04/23 0532  AST 27 22 13*  ALT 44 32 21  ALKPHOS 72 69 79  BILITOT 2.3* 2.1* 0.8  PROT 6.3* 6.1* 5.5*  ALBUMIN 3.3* 3.0* 2.6*   Recent Labs  Lab 07/02/23 1118  LIPASE 22   No results for input(s): "AMMONIA" in the last 168 hours. Coagulation Profile: No results for input(s): "INR", "PROTIME" in the last 168 hours. Cardiac Enzymes: No results for input(s): "CKTOTAL", "CKMB", "CKMBINDEX", "TROPONINI" in the last 168 hours. BNP (last 3 results) No results for input(s): "PROBNP" in the last 8760 hours. HbA1C: Recent Labs    07/03/23 1624  HGBA1C 5.4   CBG: Recent Labs  Lab 07/02/23 2136 07/03/23 1149 07/03/23 1807 07/03/23 2029 07/04/23 0748  GLUCAP 95 301* 84 85 85   Lipid Profile: No results for input(s): "CHOL", "HDL", "LDLCALC", "TRIG", "CHOLHDL", "LDLDIRECT" in the last 72 hours. Thyroid Function Tests: No results for input(s): "TSH", "T4TOTAL", "FREET4", "T3FREE", "THYROIDAB" in the last 72 hours. Anemia Panel: No results for input(s): "VITAMINB12", "FOLATE", "FERRITIN", "TIBC", "IRON", "RETICCTPCT" in the last 72 hours. Sepsis Labs: Recent Labs  Lab 07/02/23 1345 07/02/23 1630 07/02/23 2137 07/03/23 1032  PROCALCITON  --   --   --  8.28  LATICACIDVEN 2.1* 2.3* 2.5* 1.9    Recent Results (from the past 240 hours)  Resp panel by RT-PCR (RSV, Flu A&B, Covid) Anterior Nasal Swab     Status: None   Collection Time: 07/02/23 10:36 AM   Specimen: Anterior Nasal Swab  Result Value Ref Range Status   SARS Coronavirus 2 by RT PCR NEGATIVE NEGATIVE Final    Comment:  (NOTE) SARS-CoV-2 target nucleic acids are NOT DETECTED.  The SARS-CoV-2 RNA is generally detectable in upper respiratory specimens during the acute phase of infection. The lowest concentration of SARS-CoV-2 viral copies this assay can detect is 138 copies/mL. A negative result does not preclude SARS-Cov-2 infection and should not be used as the sole basis for treatment or other patient management decisions. A negative result may occur with  improper specimen collection/handling, submission of specimen other than nasopharyngeal swab, presence of viral mutation(s) within the areas targeted by this assay, and inadequate number of viral copies(<138 copies/mL). A negative result must be combined with clinical observations, patient history, and epidemiological information. The expected result is Negative.  Fact Sheet for Patients:  BloggerCourse.com  Fact Sheet  for Healthcare Providers:  SeriousBroker.it  This test is no t yet approved or cleared by the [place] and  has been authorized for detection and/or diagnosis of SARS-CoV-2 by FDA under an Emergency Use Authorization (EUA). This EUA will remain  in effect (meaning this test can be used) for the duration of the COVID-19 declaration under Section 564(b)(1) of the Act, 21 U.S.C.section 360bbb-3(b)(1), unless the authorization is terminated  or revoked sooner.       Influenza A by PCR NEGATIVE NEGATIVE Final   Influenza B by PCR NEGATIVE NEGATIVE Final    Comment: (NOTE) The Xpert Xpress SARS-CoV-2/FLU/RSV plus assay is intended as an aid in the diagnosis of influenza from Nasopharyngeal swab specimens and should not be used as a sole basis for treatment. Nasal washings and aspirates are unacceptable for Xpert Xpress SARS-CoV-2/FLU/RSV testing.  Fact Sheet for Patients: BloggerCourse.com  Fact Sheet for Healthcare  Providers: SeriousBroker.it  This test is not yet approved or cleared by the [place] FDA and has been authorized for detection and/or diagnosis of SARS-CoV-2 by FDA under an Emergency Use Authorization (EUA). This EUA will remain in effect (meaning this test can be used) for the duration of the COVID-19 declaration under Section 564(b)(1) of the Act, 21 U.S.C. section 360bbb-3(b)(1), unless the authorization is terminated or revoked.     Resp Syncytial Virus by PCR NEGATIVE NEGATIVE Final    Comment: (NOTE) Fact Sheet for Patients: BloggerCourse.com  Fact Sheet for Healthcare Providers: SeriousBroker.it  This test is not yet approved or cleared by the [place] FDA and has been authorized for detection and/or diagnosis of SARS-CoV-2 by FDA under an Emergency Use Authorization (EUA). This EUA will remain in effect (meaning this test can be used) for the duration of the COVID-19 declaration under Section 564(b)(1) of the Act, 21 U.S.C. section 360bbb-3(b)(1), unless the authorization is terminated or revoked.  Performed at [facility], [address] [place], [place] [address]   Blood culture (routine x 2)     Status: None (Preliminary result)   Collection Time: 07/02/23  1:45 PM   Specimen: BLOOD  Result Value Ref Range Status   Specimen Description   Final    BLOOD LEFT ANTECUBITAL Performed at [facility], [address] [place], [place] [address]    Special Requests   Final    BOTTLES DRAWN AEROBIC AND ANAEROBIC Blood Culture results may not be optimal due to an inadequate volume of blood received in culture bottles Performed at [facility], [address] [place], [place] [address]    Culture   Final    NO GROWTH 2 DAYS Performed at [facility], [address] [place], [place] [address]    Report Status PENDING  Incomplete  Culture,  blood (Routine X 2) w Reflex to ID Panel     Status: None (Preliminary result)   Collection Time: 07/02/23  7:40 PM   Specimen: BLOOD RIGHT HAND  Result Value Ref Range Status   Specimen Description   Final    BLOOD RIGHT HAND Performed at [facility], [address], [place], [place] [address]    Special Requests   Final    BOTTLES DRAWN AEROBIC ONLY Blood Culture results may not be optimal due to an inadequate volume of blood received in culture bottles Performed at [facility], [address], [place], [place] [address]    Culture  Final    NO GROWTH 1 DAY Performed at [facility], [address], [place], [place] [address]    Report Status PENDING  Incomplete  Respiratory (~20 pathogens) panel by PCR     Status: None   Collection Time: 07/03/23 12:35 AM   Specimen: Nasopharyngeal Swab; Respiratory  Result Value Ref Range Status   Adenovirus NOT DETECTED NOT DETECTED Final   Coronavirus 229E NOT DETECTED NOT DETECTED Final    Comment: (NOTE) The Coronavirus on the Respiratory Panel, DOES NOT test for the novel  Coronavirus (2019 nCoV)    Coronavirus HKU1 NOT DETECTED NOT DETECTED Final   Coronavirus NL63 NOT DETECTED NOT DETECTED Final   Coronavirus OC43 NOT DETECTED NOT DETECTED Final   Metapneumovirus NOT DETECTED NOT DETECTED Final   Rhinovirus / Enterovirus NOT DETECTED NOT DETECTED Final   Influenza A NOT DETECTED NOT DETECTED Final   Influenza B NOT DETECTED NOT DETECTED Final   Parainfluenza Virus 1 NOT DETECTED NOT DETECTED Final   Parainfluenza Virus 2 NOT DETECTED NOT DETECTED Final   Parainfluenza Virus 3 NOT DETECTED NOT DETECTED Final   Parainfluenza Virus 4 NOT DETECTED NOT DETECTED Final   Respiratory Syncytial Virus NOT DETECTED NOT DETECTED Final   Bordetella pertussis NOT DETECTED NOT DETECTED Final   Bordetella Parapertussis NOT DETECTED NOT DETECTED Final   Chlamydophila pneumoniae NOT DETECTED NOT DETECTED Final    Mycoplasma pneumoniae NOT DETECTED NOT DETECTED Final    Comment: Performed at [facility], [address], [place], [place] [address]  Expectorated Sputum Assessment w Gram Stain, Rflx to Resp Cult     Status: None   Collection Time: 07/03/23  7:25 AM   Specimen: Expectorated Sputum  Result Value Ref Range Status   Specimen Description EXPECTORATED SPUTUM  Final   Special Requests NONE  Final   Sputum evaluation   Final    Sputum specimen not acceptable for testing.  Please recollect.   INFORMED K.Harris,RN to recollect on 07/03/2023 at 0856 am by SL Performed at [facility], [address] [place], [place] [address]    Report Status 07/03/2023 FINAL  Final  Expectorated Sputum Assessment w Gram Stain, Rflx to Resp Cult     Status: None   Collection Time: 07/03/23  7:41 PM  Result Value Ref Range Status   Specimen Description EXPECTORATED SPUTUM  Final   Special Requests NONE  Final   Sputum evaluation   Final    Sputum specimen not acceptable for testing.  Please recollect.   NOTIFIED C. NJA,RN 06/05/23 2114 BY K. DAVIS Performed at [facility], [address] [place], [place] [address]    Report Status 07/03/2023 FINAL  Final         Radiology Studies: ECHOCARDIOGRAM COMPLETE Result Date: 07/03/2023    ECHOCARDIOGRAM REPORT   Patient Name:   SHEVELLE SMITHER Date of Exam: 07/03/2023 Medical Rec #:  [id number]        Height:       68.0 in Accession #:    [id number]       Weight:       210.5 lb Date of Birth:  [birth year]        BSA:          2.089 m Patient Age:    27 years         BP:           105/48 mmHg Patient Gender: F  HR:           101 bpm. Exam Location:  Inpatient Procedure: 2D Echo, Cardiac Doppler and Color Doppler (Both Spectral and Color            Flow Doppler were utilized during procedure). Indications:    Syncope  History:        Patient has no prior history of Echocardiogram examinations.  Sonographer:     Darlys Gales Referring Phys: [id number] VASUNDHRA RATHORE IMPRESSIONS  1. Left ventricular ejection fraction, by estimation, is 55 to 60%. The left ventricle has normal function. The left ventricle has no regional wall motion abnormalities. Left ventricular diastolic parameters were normal.  2. Right ventricular systolic function is normal. The right ventricular size is normal.  3. The mitral valve is normal in structure. Trivial mitral valve regurgitation. No evidence of mitral stenosis.  4. The aortic valve is normal in structure. Aortic valve regurgitation is not visualized. No aortic stenosis is present.  5. The inferior vena cava is normal in size with greater than 50% respiratory variability, suggesting right atrial pressure of 3 mmHg. FINDINGS  Left Ventricle: Left ventricular ejection fraction, by estimation, is 55 to 60%. The left ventricle has normal function. The left ventricle has no regional wall motion abnormalities. The left ventricular internal cavity size was normal in size. There is  no left ventricular hypertrophy. Left ventricular diastolic parameters were normal. Right Ventricle: The right ventricular size is normal. No increase in right ventricular wall thickness. Right ventricular systolic function is normal. Left Atrium: Left atrial size was normal in size. Right Atrium: Right atrial size was normal in size. Pericardium: There is no evidence of pericardial effusion. Mitral Valve: The mitral valve is normal in structure. Trivial mitral valve regurgitation. No evidence of mitral valve stenosis. Tricuspid Valve: The tricuspid valve is normal in structure. Tricuspid valve regurgitation is trivial. No evidence of tricuspid stenosis. Aortic Valve: The aortic valve is normal in structure. Aortic valve regurgitation is not visualized. No aortic stenosis is present. Aortic valve mean gradient measures 4.0 mmHg. Aortic valve peak gradient measures 8.8 mmHg. Aortic valve area, by VTI measures 2.39 cm.  Pulmonic Valve: The pulmonic valve was normal in structure. Pulmonic valve regurgitation is not visualized. No evidence of pulmonic stenosis. Aorta: The aortic root is normal in size and structure. Venous: The inferior vena cava is normal in size with greater than 50% respiratory variability, suggesting right atrial pressure of 3 mmHg. IAS/Shunts: No atrial level shunt detected by color flow Doppler.  LEFT VENTRICLE PLAX 2D LVIDd:         5.80 cm   Diastology LVIDs:         4.10 cm   LV e' medial:    7.51 cm/s LV PW:         0.70 cm   LV E/e' medial:  11.5 LV IVS:        0.90 cm   LV e' lateral:   7.62 cm/s LVOT diam:     1.80 cm   LV E/e' lateral: 11.4 LV SV:         55 LV SV Index:   27 LVOT Area:     2.54 cm  RIGHT VENTRICLE RV S prime:     15.40 cm/s TAPSE (M-mode): 3.0 cm LEFT ATRIUM             Index        RIGHT ATRIUM  Index LA Vol (A2C):   45.3 ml 21.69 ml/m  RA Area:     17.50 cm LA Vol (A4C):   38.3 ml 18.34 ml/m  RA Volume:   46.40 ml  22.21 ml/m LA Biplane Vol: 44.0 ml 21.06 ml/m  AORTIC VALVE AV Area (Vmax):    2.27 cm AV Area (Vmean):   2.47 cm AV Area (VTI):     2.39 cm AV Vmax:           148.00 cm/s AV Vmean:          99.100 cm/s AV VTI:            0.232 m AV Peak Grad:      8.8 mmHg AV Mean Grad:      4.0 mmHg LVOT Vmax:         132.00 cm/s LVOT Vmean:        96.100 cm/s LVOT VTI:          0.218 m LVOT/AV VTI ratio: 0.94  AORTA Ao Root diam: 2.50 cm MITRAL VALVE MV Area (PHT): 5.23 cm    SHUNTS MV Decel Time: 145 msec    Systemic VTI:  0.22 m MV E velocity: 86.50 cm/s  Systemic Diam: 1.80 cm MV A velocity: 75.40 cm/s MV E/A ratio:  1.15 Arvilla Meres MD Electronically signed by Arvilla Meres MD Signature Date/Time: 07/03/2023/1:29:17 PM    Final    CT CERVICAL SPINE WO CONTRAST Result Date: 07/02/2023 CLINICAL DATA:  Left-sided neck pain, initial encounter EXAM: CT CERVICAL SPINE WITHOUT CONTRAST TECHNIQUE: Multidetector CT imaging of the cervical spine was performed  without intravenous contrast. Multiplanar CT image reconstructions were also generated. RADIATION DOSE REDUCTION: This exam was performed according to the departmental dose-optimization program which includes automated exposure control, adjustment of the mA and/or kV according to patient size and/or use of iterative reconstruction technique. COMPARISON:  02/15/2019 FINDINGS: Alignment: Mild loss of the normal cervical lordosis is noted. Skull base and vertebrae: 7 cervical segments are well visualized. Vertebral body height is well maintained. No acute fracture or acute facet abnormality is noted. The odontoid is within normal limits. Soft tissues and spinal canal: Surrounding soft tissue structures are within normal limits. Upper chest: Visualized lung apices are unremarkable. Other: None IMPRESSION: No acute abnormality noted. Electronically Signed   By: Alcide Clever M.D.   On: 07/02/2023 21:42   CT HEAD WO CONTRAST ( ) Result Date: 07/02/2023 CLINICAL DATA:  Initial evaluation for acute head trauma. EXAM: CT HEAD WITHOUT CONTRAST TECHNIQUE: Contiguous axial images were obtained from the base of the skull through the vertex without intravenous contrast. RADIATION DOSE REDUCTION: This exam was performed according to the departmental dose-optimization program which includes automated exposure control, adjustment of the mA and/or kV according to patient size and/or use of iterative reconstruction technique. COMPARISON:  None Available. FINDINGS: Brain: Cerebral volume within normal limits. No acute intracranial hemorrhage. No acute large vessel territory infarct. No mass lesion, midline shift or mass effect. No hydrocephalus or extra-axial fluid collection. Vascular: No abnormal hyperdense vessel. Skull: Scalp soft tissues within normal limits.  Calvarium intact. Sinuses/Orbits: Globes orbital soft tissues within normal limits. Scattered mucosal thickening noted about the ethmoidal air cells. Small volume  pneumatized secretions noted within the right sphenoid sinus. Mastoid air cells are clear. Other: None. IMPRESSION: 1. Normal head CT.  No acute intracranial abnormality. 2. Mild sphenoethmoidal sinus disease. Electronically Signed   By: Rise Mu M.D.   On: 07/02/2023 21:41  DG Chest 2 View Result Date: 07/02/2023 CLINICAL DATA:  Short of breath, hemoptysis EXAM: CHEST - 2 VIEW COMPARISON:  03/12/2022 FINDINGS: Frontal and lateral views of the chest demonstrate an unremarkable cardiac silhouette. Nodular areas of consolidation are seen in the right lower lobe, left upper lobe, left lower lobe. There is also dense consolidation within the left lower lobe retrocardiac region. Small left effusion. No pneumothorax. No acute bony abnormalities. IMPRESSION: 1. Multifocal areas of consolidation at the lung bases, most pronounced within the left lower lobe. Findings are consistent with bilateral pneumonia in the appropriate clinical setting, and follow-up PA and lateral chest X-ray is recommended in 3-4 weeks following trial of antibiotic therapy to ensure resolution. 2. Small left pleural effusion. Electronically Signed   By: Sharlet Salina M.D.   On: 07/02/2023 12:55           LOS: 2 days   Time spent= 35 mins    Miguel Rota, MD [facility]  If 7PM-7AM, please contact night-coverage  07/04/2023, 10:30 AM

## 2023-07-05 LAB — LEGIONELLA PNEUMOPHILA SEROGP 1 UR AG: L. pneumophila Serogp 1 Ur Ag: NEGATIVE

## 2023-07-07 LAB — CULTURE, BLOOD (ROUTINE X 2): Culture: NO GROWTH

## 2023-07-08 LAB — CULTURE, BLOOD (ROUTINE X 2): Culture: NO GROWTH

## 2023-07-10 ENCOUNTER — Inpatient Hospital Stay (HOSPITAL_COMMUNITY)
Admission: EM | Admit: 2023-07-10 | Discharge: 2023-07-12 | DRG: 195 | Disposition: A | Discharge status: Home / Self Care | Attending: Internal Medicine | Admitting: Internal Medicine

## 2023-07-10 ENCOUNTER — Emergency Department (HOSPITAL_COMMUNITY)

## 2023-07-10 ENCOUNTER — Other Ambulatory Visit: Payer: Self-pay

## 2023-07-10 ENCOUNTER — Encounter (HOSPITAL_COMMUNITY): Payer: Self-pay | Admitting: Internal Medicine

## 2023-07-10 DIAGNOSIS — Z809 Family history of malignant neoplasm, unspecified: Secondary | ICD-10-CM

## 2023-07-10 DIAGNOSIS — J181 Lobar pneumonia, unspecified organism: Secondary | ICD-10-CM | POA: Diagnosis not present

## 2023-07-10 DIAGNOSIS — Z6836 Body mass index (BMI) 36.0-36.9, adult: Secondary | ICD-10-CM

## 2023-07-10 DIAGNOSIS — Z880 Allergy status to penicillin: Secondary | ICD-10-CM | POA: Diagnosis not present

## 2023-07-10 DIAGNOSIS — J45909 Unspecified asthma, uncomplicated: Secondary | ICD-10-CM | POA: Diagnosis present

## 2023-07-10 DIAGNOSIS — Z833 Family history of diabetes mellitus: Secondary | ICD-10-CM

## 2023-07-10 DIAGNOSIS — J189 Pneumonia, unspecified organism: Principal | ICD-10-CM | POA: Diagnosis present

## 2023-07-10 DIAGNOSIS — F325 Major depressive disorder, single episode, in full remission: Secondary | ICD-10-CM | POA: Diagnosis present

## 2023-07-10 DIAGNOSIS — Z7951 Long term (current) use of inhaled steroids: Secondary | ICD-10-CM

## 2023-07-10 DIAGNOSIS — D509 Iron deficiency anemia, unspecified: Secondary | ICD-10-CM | POA: Diagnosis present

## 2023-07-10 DIAGNOSIS — R0602 Shortness of breath: Secondary | ICD-10-CM | POA: Diagnosis present

## 2023-07-10 DIAGNOSIS — Z9049 Acquired absence of other specified parts of digestive tract: Secondary | ICD-10-CM

## 2023-07-10 DIAGNOSIS — F32A Depression, unspecified: Secondary | ICD-10-CM | POA: Diagnosis present

## 2023-07-10 LAB — CBC
HCT: 41.8 % (ref 36.0–46.0)
Hemoglobin: 12.6 g/dL (ref 12.0–15.0)
MCH: 26.5 pg (ref 26.0–34.0)
MCHC: 30.1 g/dL (ref 30.0–36.0)
MCV: 88 fL (ref 80.0–100.0)
Platelets: 501 10*3/uL — ABNORMAL HIGH (ref 150–400)
RBC: 4.75 MIL/uL (ref 3.87–5.11)
RDW: 14.8 % (ref 11.5–15.5)
WBC: 12.2 10*3/uL — ABNORMAL HIGH (ref 4.0–10.5)
nRBC: 0 % (ref 0.0–0.2)

## 2023-07-10 LAB — BASIC METABOLIC PANEL
Anion gap: 11 (ref 5–15)
BUN: 12 mg/dL (ref 6–20)
CO2: 22 mmol/L (ref 22–32)
Calcium: 9.9 mg/dL (ref 8.9–10.3)
Chloride: 103 mmol/L (ref 98–111)
Creatinine, Ser: 0.73 mg/dL (ref 0.44–1.00)
GFR, Estimated: >60 mL/min (ref >60)
Glucose, Bld: 127 mg/dL — ABNORMAL HIGH (ref 70–99)
Potassium: 3.8 mmol/L (ref 3.5–5.1)
Sodium: 136 mmol/L (ref 135–145)

## 2023-07-10 LAB — TROPONIN I (HIGH SENSITIVITY)
Troponin I (High Sensitivity): 2 ng/L (ref <18)
Troponin I (High Sensitivity): <2 ng/L (ref <18)

## 2023-07-10 LAB — HCG, SERUM, QUALITATIVE: Preg, Serum: NEGATIVE

## 2023-07-10 MED ORDER — ENOXAPARIN SODIUM 40 MG/0.4ML IJ SOSY
40.0000 mg | PREFILLED_SYRINGE | Freq: Every day | INTRAMUSCULAR | Status: DC
  Administered 2023-07-10 – 2023-07-11 (×2): 40 mg via SUBCUTANEOUS
  Filled 2023-07-10 (×2): qty 0.4

## 2023-07-10 MED ORDER — ALBUTEROL SULFATE (2.5 MG/3ML) 0.083% IN NEBU: 2.5000 mg | INHALATION_SOLUTION | Freq: Four times a day (QID) | RESPIRATORY_TRACT | Status: DC | PRN

## 2023-07-10 MED ORDER — SODIUM CHLORIDE 0.9 % IV SOLN
500.0000 mg | INTRAVENOUS | Status: DC
  Administered 2023-07-10 – 2023-07-11 (×2): 500 mg via INTRAVENOUS
  Filled 2023-07-10 (×2): qty 5

## 2023-07-10 MED ORDER — LORATADINE 10 MG PO TABS
10.0000 mg | ORAL_TABLET | Freq: Every day | ORAL | Status: DC
  Administered 2023-07-11 – 2023-07-12 (×2): 10 mg via ORAL
  Filled 2023-07-10 (×2): qty 1

## 2023-07-10 MED ORDER — PREDNISONE 20 MG PO TABS
40.0000 mg | ORAL_TABLET | Freq: Two times a day (BID) | ORAL | Status: DC
  Administered 2023-07-11: 40 mg via ORAL
  Filled 2023-07-10: qty 2

## 2023-07-10 MED ORDER — MOMETASONE FURO-FORMOTEROL FUM 100-5 MCG/ACT IN AERO
2.0000 | INHALATION_SPRAY | Freq: Two times a day (BID) | RESPIRATORY_TRACT | Status: DC
  Administered 2023-07-11 – 2023-07-12 (×3): 2 via RESPIRATORY_TRACT
  Filled 2023-07-10: qty 8.8

## 2023-07-10 MED ORDER — SODIUM CHLORIDE 0.9 % IV SOLN
2.0000 g | INTRAVENOUS | Status: DC
  Administered 2023-07-10 – 2023-07-11 (×2): 2 g via INTRAVENOUS
  Filled 2023-07-10 (×2): qty 20

## 2023-07-10 MED ORDER — ALBUTEROL SULFATE HFA 108 (90 BASE) MCG/ACT IN AERS: 2.0000 | INHALATION_SPRAY | Freq: Four times a day (QID) | RESPIRATORY_TRACT | Status: DC | PRN

## 2023-07-10 MED ORDER — SODIUM CHLORIDE 0.9 % IV SOLN: INTRAVENOUS | Status: DC

## 2023-07-10 MED ORDER — IOHEXOL 350 MG/ML SOLN
75.0000 mL | Freq: Once | INTRAVENOUS | Status: AC | PRN
  Administered 2023-07-10: 75 mL via INTRAVENOUS

## 2023-07-10 NOTE — H&P (Signed)
 History and Physical    Patient: Megan Barnes NFA:[id number] DOB: 03/19/1996 DOA: 07/10/2023 DOS: the patient was seen and examined on 07/10/2023 PCP: Pcp, No  Patient coming from: Home  Chief Complaint:  Chief Complaint  Patient presents with   Shortness of Breath   HPI: Megan Barnes is a 28 y.o. female with medical history significant of asthma, abdominal pain, cholelithiasis, depression, morbid obesity, iron deficiency anemia, recent pneumonia, who was treated recently with oral agents but continues to be symptomatic.  Patient has left lower lobe pneumonia and was treated with Keflex and azithromycin.  Patient completed antibiotics but still symptomatic.  Came back to the ER today where she is seen on CT chest to have left lower lobe consolidation and right middle lobe infiltrate which is new concerning for pneumonia.  She still has some leukocytosis.  She is coughing a lot.  At this point patient is being admitted to the hospital for treatment of pneumonia that has not cleared up with oral agents.  Review of Systems: As mentioned in the history of present illness. All other systems reviewed and are negative. Past Medical History:  Diagnosis Date   Abdominal pain    Anemia    Asthma    Depression    Pneumonia    Past Surgical History:  Procedure Laterality Date   CHOLECYSTECTOMY N/A 03/05/2015   Procedure: LAPAROSCOPIC CHOLECYSTECTOMY ;  Surgeon: Lattie Haw, MD;  Location: [facility] ORS;  Service: General;  Laterality: N/A;   POLYDACTYLY RECONSTRUCTION     Social History:  reports that she has never smoked. She has never used smokeless tobacco. She reports current alcohol use of about 3.0 standard drinks of alcohol per week. She reports current drug use. Drug: Marijuana.  Allergies  Allergen Reactions   Penicillins Hives    Family History  Problem Relation Age of Onset   Cancer Maternal Aunt    Diabetes Maternal Uncle    Cancer Maternal Uncle    Cancer Maternal  Grandmother    Diabetes Maternal Grandfather     Prior to Admission medications   Medication Sig Start Date End Date Taking? Authorizing Provider  albuterol (PROVENTIL) (2.5 MG/3ML) 0.083% nebulizer solution Take 3 mLs (2.5 mg total) by nebulization every 4 (four) hours as needed for wheezing or shortness of breath. 03/12/22 07/03/23  Merwyn Katos, MD  albuterol (VENTOLIN HFA) 108 (90 Base) MCG/ACT inhaler Inhale 2 puffs into the lungs every 6 (six) hours as needed for wheezing or shortness of breath. 12/03/21   Varney Daily, PA  budesonide-formoterol (SYMBICORT) 80-4.5 MCG/ACT inhaler Inhale 2 puffs into the lungs daily. Patient taking differently: Inhale 2 puffs into the lungs 2 (two) times daily. 03/12/22   Merwyn Katos, MD  fexofenadine (ALLEGRA) 180 MG tablet Take 180 mg by mouth daily.    [provider]  ibuprofen (ADVIL) 200 MG tablet Take 200 mg by mouth every 6 (six) hours as needed for moderate pain (pain score 4-6).    [provider]    Physical Exam: Vitals:   07/10/23 1014  BP: (!) 155/102  Pulse: (!) 106  Resp: 18  Temp: 98.4 F (36.9 C)  TempSrc: Oral  SpO2: 97%   Constitutional: Acutely ill looking, obese, NAD, calm, comfortable Eyes: PERRL, lids and conjunctivae normal ENMT: Mucous membranes are moist. Posterior pharynx clear of any exudate or lesions.Normal dentition.  Neck: normal, supple, no masses, no thyromegaly Respiratory: clear to auscultation bilaterally, no wheezing, no crackles. Normal respiratory  effort. No accessory muscle use.  Cardiovascular: Sinus tachycardia, no murmurs / rubs / gallops. No extremity edema. 2+ pedal pulses. No carotid bruits.  Abdomen: no tenderness, no masses palpated. No hepatosplenomegaly. Bowel sounds positive.  Musculoskeletal: Good range of motion, no joint swelling or tenderness, Skin: no rashes, lesions, ulcers. No induration Neurologic: CN 2-12 grossly intact. Sensation intact, DTR normal.  Strength 5/5 in all 4.  Psychiatric: Normal judgment and insight. Alert and oriented x 3. Normal mood  Data Reviewed:  Temperature 98.4, blood pressure 1 5502, pulse 106, white count 12.2 platelets 501.  Chest x-ray showed persistent consolidation in the left midlung and interval increase in size of moderate left pleural effusion slight interval improvement in previously described right lower lobe opacities.  CT angiogram of the chest showed no PE but small left pleural effusion and multifocal infiltrates.  Patient is being admitted for further evaluation and treatment  Assessment and Plan:  #1 persistent worsening pneumonia: Patient appears to have failed outpatient treatment.  Will admit the patient.  Initiate IV Rocephin and Zithromax.  Cultures obtained.  Continue other supportive care.  #2 depression: Confirm on resume home regimen  #3 morbid obesity: Dietary counseling  #4 history of asthma: Within pneumonia will continue breathing treatments.  Does not appear to have exacerbation.  Patient however may benefit from some steroids.   Advance Care Planning:   Code Status: Full Code   Consults: None  Family Communication: No family at bedside.  Severity of Illness: The appropriate patient status for this patient is INPATIENT. Inpatient status is judged to be reasonable and necessary in order to provide the required intensity of service to ensure the patient's safety. The patient's presenting symptoms, physical exam findings, and initial radiographic and laboratory data in the context of their chronic comorbidities is felt to place them at high risk for further clinical deterioration. Furthermore, it is not anticipated that the patient will be medically stable for discharge from the hospital within 2 midnights of admission.   * I certify that at the point of admission it is my clinical judgment that the patient will require inpatient hospital care spanning beyond 2 midnights from the point  of admission due to high intensity of service, high risk for further deterioration and high frequency of surveillance required.*  AuthorLonia Blood, MD 07/10/2023 6:28 PM  For on call review [email or web address].

## 2023-07-10 NOTE — ED Provider Notes (Signed)
 [place] EMERGENCY DEPARTMENT AT [facility] Provider Note   CSN: [id number] Arrival date & time: 07/10/23  0957     History  Chief Complaint  Patient presents with   Shortness of Breath    Katelynd Blauvelt Olivera is a 28 y.o. female.   Shortness of Breath Associated symptoms: chest pain    Patient is a 28 year old female presents the ED today complaining of pleuritic chest pain, shortness of breath that has been present since being in the hospital for multifocal pneumonia.  Was discharged 1 week ago.  With outpatient Keflex and azithromycin which she completed.  Using albuterol inhaler with minimal relief.  Previous medical history of multifocal pneumonia, asthma, HSV 1, anemia, sepsis.  Not on birth control.  Endorses persistent cough, accompanying with occasional chest pain.  Denies fevers, vision changes, sore throat, nausea, vomiting, diarrhea, abdominal pain, dysuria, leg swelling.     Home Medications Prior to Admission medications   Medication Sig Start Date End Date Taking? Authorizing Provider  albuterol (PROVENTIL) (2.5 MG/3ML) 0.083% nebulizer solution Take 3 mLs (2.5 mg total) by nebulization every 4 (four) hours as needed for wheezing or shortness of breath. 03/12/22 07/03/23  Merwyn Katos, MD  albuterol (VENTOLIN HFA) 108 (90 Base) MCG/ACT inhaler Inhale 2 puffs into the lungs every 6 (six) hours as needed for wheezing or shortness of breath. 12/03/21   Varney Daily, PA  budesonide-formoterol (SYMBICORT) 80-4.5 MCG/ACT inhaler Inhale 2 puffs into the lungs daily. Patient taking differently: Inhale 2 puffs into the lungs 2 (two) times daily. 03/12/22   Merwyn Katos, MD  fexofenadine (ALLEGRA) 180 MG tablet Take 180 mg by mouth daily.    [provider]  ibuprofen (ADVIL) 200 MG tablet Take 200 mg by mouth every 6 (six) hours as needed for moderate pain (pain score 4-6).    [provider]      Allergies    Penicillins    Review  of Systems   Review of Systems  Respiratory:  Positive for shortness of breath.   Cardiovascular:  Positive for chest pain.  Musculoskeletal:  Positive for back pain.  All other systems reviewed and are negative.   Physical Exam Updated Vital Signs BP (!) 155/102 (BP Location: Left Arm)   Pulse (!) 106   Temp 98.4 F (36.9 C) (Oral)   Resp 18   LMP 06/15/2023 (Approximate)   SpO2 97%  Physical Exam Vitals and nursing note reviewed.  Constitutional:      General: She is not in acute distress.    Appearance: Normal appearance. She is not ill-appearing.  HENT:     Head: Normocephalic and atraumatic.     Mouth/Throat:     Mouth: Mucous membranes are moist.     Pharynx: Oropharynx is clear. No pharyngeal swelling or oropharyngeal exudate.  Eyes:     Extraocular Movements: Extraocular movements intact.     Conjunctiva/sclera: Conjunctivae normal.  Neck:     Vascular: No JVD.     Trachea: No tracheal deviation.  Cardiovascular:     Rate and Rhythm: Normal rate and regular rhythm.     Pulses: Normal pulses.     Heart sounds: Normal heart sounds. No murmur heard.    No friction rub. No gallop.  Pulmonary:     Effort: Pulmonary effort is normal. No tachypnea, accessory muscle usage or respiratory distress.     Breath sounds: Examination of the left-upper field reveals decreased breath sounds. Examination of the left-middle  field reveals decreased breath sounds. Examination of the left-lower field reveals decreased breath sounds. Decreased breath sounds present. No wheezing, rhonchi or rales.  Chest:     Chest wall: Tenderness (Patient is notably tender over the posterior ribs on the left side with focal tenderness over the lateral aspect of rib 10.) present. No deformity or crepitus.  Abdominal:     General: Abdomen is flat.     Palpations: Abdomen is soft.     Tenderness: There is no abdominal tenderness.  Musculoskeletal:     Cervical back: Normal range of motion.     Right  lower leg: No tenderness. No edema.     Left lower leg: No tenderness. No edema.  Skin:    General: Skin is warm and dry.     Coloration: Skin is not pale.     Findings: No ecchymosis or erythema.  Neurological:     General: No focal deficit present.     Mental Status: She is alert. Mental status is at baseline.  Psychiatric:        Mood and Affect: Mood normal.     ED Results / Procedures / Treatments   Labs (all labs ordered are listed, but only abnormal results are displayed) Labs Reviewed  BASIC METABOLIC PANEL - Abnormal; Notable for the following components:      Result Value   Glucose, Bld 127 (*)    All other components within normal limits  CBC - Abnormal; Notable for the following components:   WBC 12.2 (*)    Platelets 501 (*)    All other components within normal limits  HCG, SERUM, QUALITATIVE  TROPONIN I (HIGH SENSITIVITY)  TROPONIN I (HIGH SENSITIVITY)    EKG None  Radiology DG Chest 2 View Result Date: 07/10/2023 CLINICAL DATA:  Chest pain EXAM: CHEST - 2 VIEW COMPARISON:  Chest radiograph 07/02/2023 FINDINGS: Stable cardiac and mediastinal contours. Persistent consolidation left mid lung. Interval increase in size of moderate left pleural effusion. No pneumothorax. Osseous structures unremarkable. Slight interval improvement in previously described right lower lung consolidative opacities. IMPRESSION: 1. Persistent consolidation left mid lung. 2. Interval increase in size of moderate left pleural effusion. 3. Slight interval improvement in previously described right lower lung consolidative opacities. 4. Continued radiographic follow-up is recommended to ensure resolution. Electronically Signed   By: Annia Belt M.D.   On: 07/10/2023 11:23    Procedures Procedures    Medications Ordered in ED Medications  iohexol (OMNIPAQUE) 350 MG/ML injection 75 mL (75 mLs Intravenous Contrast Given 07/10/23 1426)    ED Course/ Medical Decision Making/ A&P                                PERC Score: 1, PERC Score Interpretation: If any criteria are positive, the PERC rule cannot be used to rule out PE in this patient Medical Decision Making Amount and/or Complexity of Data Reviewed Labs: ordered. Radiology: ordered.   Patient is a 28 year old female presents the ED today complaining of pleuritic chest pain, shortness of breath that has been present since being in the hospital for multifocal pneumonia.  Was discharged 1 week ago.  With outpatient Keflex and azithromycin which she completed.  Using albuterol inhaler with minimal relief.  Previous medical history of multifocal pneumonia, asthma, HSV 1, anemia, sepsis.  Not on birth control.  On physical exam, patient was noted to have decreased breath sounds on left side and  was noted to be tender to palpation focally at the posterior aspect of the left lower ribs, around rib 10. Noted to wince when taking a deep breath. Noted to have been tachycardic in triage however had normal heart rate during evaluation and was not tachypneic and in no respiratory distress.  Afebrile, speaking in full sentences.  Exam was otherwise unremarkable.  Due to being tachycardic in triage, unable to [facility] her out.  And with recent multifocal pneumonia and noted effusion on chest x-ray, will CT her chest to evaluate for possible clot/evaluate effusion further. CBC had been notably improved with decreased white count of 12.2 from 7 days ago which was 13.7.  However platelets are elevated from previous at 501 from 246.  Labs otherwise unremarkable.   CT pending at this time, patient care being transferred over to Leane Call, PA-C at shift change.  Anticipate that she may need admission due to continued symptoms after being discharged with no follow-up attended.  Differential diagnoses prior to evaluation: The emergent differential diagnosis includes, but is not limited to, pneumonia, bronchitis, atelectasis, PE, ACS, muscle strain,  costochondritis,. This is not an exhaustive differential.   Past Medical History / Co-morbidities / Social History: HSV 1, sepsis, asthma, pneumonia  Additional history: Chart reviewed. Pertinent results include:  Discharged from the hospital on 07/04/2023 for multifocal pneumonia.  Discharged with azithromycin and Keflex and was told to follow-up with PCP in 1 week for repeat CBC and BMP.   Lab Tests/Imaging studies: I personally interpreted labs/imaging and the pertinent results include:   CBC is notable for elevated white count of 12.2 and elevated platelets of 501 BMP unremarkable Initial troponin unremarkable hCG qualitative negative  Chest x-ray did note persistent consolidation in the left midlung with increasing moderate left pleural effusion and slight improvement from previous and right lower lung consolidative opacities. CT pending  I agree with the radiologist interpretation.  Cardiac monitoring: EKG obtained and interpreted by myself and attending physician which shows: Sinus tachycardia .muse   Medications: No medications were provided at this time.  Disposition: 4:00 PM Care of Nariah Radle transferred to PA Sabra Heck and Dr. Charm Barges at the end of my shift as the patient will require reassessment once labs/imaging have resulted. Patient presentation, ED course, and plan of care discussed with review of all pertinent labs and imaging. Please see his/her note for further details regarding further ED course and disposition.   Plan at time of handoff is evaluate PE study for PE and extent of effusion, consult pulmonology/critical care for possible admission for IR drainage and/or antibiotic recommendations, anticipate this patient will most likely need admission. This may be altered or completely changed at the discretion of the oncoming team pending results of further workup.   Final Clinical Impression(s) / ED Diagnoses Final diagnoses:  SOB (shortness of breath)     Rx / DC Orders ED Discharge Orders     None         Lavonia Drafts 07/10/23 1602    Wynetta Fines, MD 07/10/23 [phone]

## 2023-07-10 NOTE — ED Provider Notes (Signed)
 Received patient at signout from previous provider pending CT PE.  See his note.  In short, patient presents to emergency department for evaluation of chest pain that is worsened with deep inspiration and shortness of breath.  She was recently discharged from admission for left lower lobe pneumonia with Keflex and azithromycin. She has completed both antibiotics.  ED workup today is notable for left lower lobe consolidation and right middle lobe infiltrate concerning for pneumonia on CT PE.  Downtrending mild leukocytosis of 12.2.  Troponin x 1 negative.  Patient completed outpatient antibiotics with no improvement to pneumonia.  Consulted hospitalist Dr. Earlie Lou accepted patient for failed outpatient management with oral antibiotics    Judithann Sheen, PA 07/10/23 Keane Police, MD 07/11/23 1113

## 2023-07-10 NOTE — ED Triage Notes (Signed)
 Pt states that she was recently admitted with CAP. Pt states that while she was admitted, she was having a L pain she states she feels "in her lungs", worse with deep inhalation or coughing. Denies recent fevers.

## 2023-07-11 DIAGNOSIS — J189 Pneumonia, unspecified organism: Secondary | ICD-10-CM | POA: Diagnosis not present

## 2023-07-11 LAB — HIV ANTIBODY (ROUTINE TESTING W REFLEX): HIV Screen 4th Generation wRfx: NONREACTIVE

## 2023-07-11 LAB — CBC
HCT: 35.6 % — ABNORMAL LOW (ref 36.0–46.0)
Hemoglobin: 10.9 g/dL — ABNORMAL LOW (ref 12.0–15.0)
MCH: 27.2 pg (ref 26.0–34.0)
MCHC: 30.6 g/dL (ref 30.0–36.0)
MCV: 88.8 fL (ref 80.0–100.0)
Platelets: 451 10*3/uL — ABNORMAL HIGH (ref 150–400)
RBC: 4.01 MIL/uL (ref 3.87–5.11)
RDW: 14.8 % (ref 11.5–15.5)
WBC: 10.5 10*3/uL (ref 4.0–10.5)
nRBC: 0 % (ref 0.0–0.2)

## 2023-07-11 LAB — COMPREHENSIVE METABOLIC PANEL
ALT: 38 U/L (ref 0–44)
AST: 22 U/L (ref 15–41)
Albumin: 2.8 g/dL — ABNORMAL LOW (ref 3.5–5.0)
Alkaline Phosphatase: 79 U/L (ref 38–126)
Anion gap: 7 (ref 5–15)
BUN: 16 mg/dL (ref 6–20)
CO2: 22 mmol/L (ref 22–32)
Calcium: 9 mg/dL (ref 8.9–10.3)
Chloride: 107 mmol/L (ref 98–111)
Creatinine, Ser: 0.77 mg/dL (ref 0.44–1.00)
GFR, Estimated: >60 mL/min (ref >60)
Glucose, Bld: 101 mg/dL — ABNORMAL HIGH (ref 70–99)
Potassium: 3.9 mmol/L (ref 3.5–5.1)
Sodium: 136 mmol/L (ref 135–145)
Total Bilirubin: 0.2 mg/dL (ref 0.0–1.2)
Total Protein: 6.4 g/dL — ABNORMAL LOW (ref 6.5–8.1)

## 2023-07-11 MED ORDER — KETOROLAC TROMETHAMINE 15 MG/ML IJ SOLN
15.0000 mg | Freq: Once | INTRAMUSCULAR | Status: AC
  Administered 2023-07-11: 15 mg via INTRAVENOUS
  Filled 2023-07-11: qty 1

## 2023-07-11 NOTE — Progress Notes (Signed)
  Progress Note   Patient: Megan Barnes:[id number] DOB: Dec 24, 1995 DOA: 07/10/2023     1 DOS: the patient was seen and examined on 07/11/2023   Brief hospital course: 28 year old female with history of asthma, obesity, iron deficiency anemia, recent diagnosis of pneumonia as failed treatment with short course of outpatient antibiotics, presenting with worsening left lower lobe pain and cough.  Assessment and Plan:   Multifocal pneumonia - Chest x-ray personally reviewed noting frank LLE consolidation.  Chest CT noting left lower lobe as well as right middle lobe opacities suggestive of worsening pneumonia.  Patient with left lower lobe pain suggestive of pleuritis as well.  Failed 3 days of outpatient Keflex plus azithromycin.  Currently receiving ceftriaxone plus azithromycin while inpatient.  Patient not hypoxic, afebrile, improving leukocytosis.  Will give IV Toradol x 1 for pleuritic pain.  Believe that antibiotic duration for previous pneumonia was not long enough.  Continue IV antibiotics while inpatient, transition to p.o. third GEN cephalosporin plus azithromycin or Doxy upon discharge.  Asthma - Does not appear to be in acute exacerbation.  Nebulizers on as needed.  Patient not hypoxic nor dyspneic.  Will discontinue steroids.  Depression - Resume home medication regiment.  Obesity - Encouraged dieting.    Goals of care -Patient still with pleuritic pain and some mild dyspnea.  Patient eager for discharge but has reservations felt her pain.  Instructed patient will give Toradol and continue IV while she is inpatient however given no hypoxia, improving leukocytosis, that she could be discharged later today or tomorrow at her decision.     Subjective: Patient feeling improved this morning.  Still having left-sided sharp stabbing abdominal pain worse with deep breaths.  Denies any fever, worsening shortness of breath, nausea, vomiting, abdominal pain.  Physical Exam: Vitals:    07/10/23 2056 07/11/23 0040 07/11/23 0445 07/11/23 0818  BP: (!) 156/75 (!) 149/71 129/67 (!) 140/75  Pulse: 85 84 83 76  Resp: 18 17 18 17   Temp: 97.7 F (36.5 C) 97.6 F (36.4 C) 98.3 F (36.8 C) 97.7 F (36.5 C)  TempSrc: Oral Oral Oral Oral  SpO2: 96% 98% 96% 99%  Weight: 109.3 kg     Height: 5\' 8"  (1.727 m)      GENERAL:  Alert, pleasant, no acute distress  HEENT:  EOMI CARDIOVASCULAR:  RRR, no murmurs appreciated RESPIRATORY:  Clear to auscultation, minimally dyspneic, large breath halted by pain GASTROINTESTINAL:  Soft, nontender, nondistended EXTREMITIES:  No LE edema bilaterally NEURO:  No new focal deficits appreciated SKIN:  No rashes noted PSYCH:  Appropriate mood and affect   Data Reviewed:  Chest x-ray personally reviewed noting frank left lower lobe opacification  Family Communication: None  Disposition: Status is: Inpatient Remains inpatient appropriate because: Multifocal pneumonia failing outpatient therapy  Planned Discharge Destination: Home    Time spent: 32 minutes  Author: Deanna Artis, DO 07/11/2023 12:24 PM  For on call review [email or web address].

## 2023-07-11 NOTE — Plan of Care (Signed)
  Problem: Education: Goal: Knowledge of General Education information will improve Description: Including pain rating scale, medication(s)/side effects and non-pharmacologic comfort measures Outcome: Progressing   Problem: Health Behavior/Discharge Planning: Goal: Ability to manage health-related needs will improve Outcome: Progressing   Problem: Clinical Measurements: Goal: Ability to maintain clinical measurements within normal limits will improve Outcome: Progressing Goal: Will remain free from infection Outcome: Progressing Goal: Diagnostic test results will improve Outcome: Progressing Goal: Respiratory complications will improve Outcome: Progressing   Problem: Activity: Goal: Risk for activity intolerance will decrease Outcome: Progressing   Problem: Nutrition: Goal: Adequate nutrition will be maintained Outcome: Progressing   Problem: Coping: Goal: Level of anxiety will decrease Outcome: Progressing   Problem: Elimination: Goal: Will not experience complications related to bowel motility Outcome: Progressing   Problem: Pain Managment: Goal: General experience of comfort will improve and/or be controlled Outcome: Progressing   Problem: Safety: Goal: Ability to remain free from injury will improve Outcome: Progressing

## 2023-07-11 NOTE — Plan of Care (Signed)

## 2023-07-11 NOTE — Progress Notes (Signed)
   07/11/23 1308  TOC Brief Assessment  Insurance and Status Reviewed  Patient has primary care physician Yes  Home environment has been reviewed home w/ family  Prior level of function: independent  Prior/Current Home Services No current home services  Social Drivers of Health Review SDOH reviewed no interventions necessary  Readmission risk has been reviewed Yes  Transition of care needs no transition of care needs at this time

## 2023-07-12 ENCOUNTER — Other Ambulatory Visit (HOSPITAL_COMMUNITY): Payer: Self-pay

## 2023-07-12 DIAGNOSIS — J181 Lobar pneumonia, unspecified organism: Secondary | ICD-10-CM

## 2023-07-12 LAB — BASIC METABOLIC PANEL
Anion gap: 7 (ref 5–15)
BUN: 14 mg/dL (ref 6–20)
CO2: 21 mmol/L — ABNORMAL LOW (ref 22–32)
Calcium: 9.1 mg/dL (ref 8.9–10.3)
Chloride: 106 mmol/L (ref 98–111)
Creatinine, Ser: 0.67 mg/dL (ref 0.44–1.00)
GFR, Estimated: >60 mL/min (ref >60)
Glucose, Bld: 98 mg/dL (ref 70–99)
Potassium: 3.8 mmol/L (ref 3.5–5.1)
Sodium: 134 mmol/L — ABNORMAL LOW (ref 135–145)

## 2023-07-12 LAB — CBC
HCT: 34 % — ABNORMAL LOW (ref 36.0–46.0)
Hemoglobin: 10.7 g/dL — ABNORMAL LOW (ref 12.0–15.0)
MCH: 27.3 pg (ref 26.0–34.0)
MCHC: 31.5 g/dL (ref 30.0–36.0)
MCV: 86.7 fL (ref 80.0–100.0)
Platelets: 424 10*3/uL — ABNORMAL HIGH (ref 150–400)
RBC: 3.92 MIL/uL (ref 3.87–5.11)
RDW: 14.9 % (ref 11.5–15.5)
WBC: 11.5 10*3/uL — ABNORMAL HIGH (ref 4.0–10.5)
nRBC: 0 % (ref 0.0–0.2)

## 2023-07-12 MED ORDER — AZITHROMYCIN 250 MG PO TABS: 500.0000 mg | ORAL_TABLET | Freq: Every day | ORAL | Status: DC

## 2023-07-12 MED ORDER — GUAIFENESIN-DM 100-10 MG/5ML PO SYRP
5.0000 mL | ORAL_SOLUTION | ORAL | Status: DC | PRN
  Administered 2023-07-12: 5 mL via ORAL
  Filled 2023-07-12: qty 5

## 2023-07-12 MED ORDER — CEFUROXIME AXETIL 500 MG PO TABS
500.0000 mg | ORAL_TABLET | Freq: Two times a day (BID) | ORAL | 0 refills | Status: AC
  Filled 2023-07-12: qty 8, 4d supply, fill #0

## 2023-07-12 MED ORDER — AZITHROMYCIN 250 MG PO TABS
250.0000 mg | ORAL_TABLET | Freq: Every day | ORAL | 0 refills | Status: AC
Start: 2023-07-13 — End: ?
  Filled 2023-07-12: qty 4, 4d supply, fill #0

## 2023-07-12 MED ORDER — ORAL CARE MOUTH RINSE: 15.0000 mL | OROMUCOSAL | Status: DC | PRN

## 2023-07-12 NOTE — Discharge Summary (Signed)
 Physician Discharge Summary  Megan Barnes ZOX:[id number] DOB: 11/09/1995 DOA: 07/10/2023  PCP: Pcp, No  Admit date: 07/10/2023 Discharge date: 07/12/2023 Discharging to: home     Discharge Diagnoses:   Principal Problem:   Pneumonia Active Problems:   Morbid obesity (HCC) 236 lbs   Asthma   Depression, major, dx'd age 28     Hospital Course:  This is a 28 year old female with asthma, morbid obesity, iron deficiency anemia and recent pneumonia who presented to the hospital for shortness of breath.  The patient was recently treated with cephalexin and azithromycin but due to continued symptoms, she presented to the ED.   She states she took Keflex for 3 days and Azithromycin for 2 days. CT of the chest revealed left lower lobe consolidation and right middle lobe infiltrate.  WBC count noted to be 12.2.  Principal Problem:   Pneumonia with left sided pleuritic chest pain - has left sided pain in lower ribs with deep breaths along with a cough -She was started on ceftriaxone and azithromycin and is stable to dc today  Active Problems:   Morbid obesity  Body mass index is 36.64 kg/m.    Asthma -No exacerbation noted          Discharge Instructions  Discharge Instructions     Diet - low sodium heart healthy   Complete by: As directed    Increase activity slowly   Complete by: As directed       Allergies as of 07/12/2023       Reactions   Penicillins Hives        Medication List     TAKE these medications    albuterol 108 (90 Base) MCG/ACT inhaler Commonly known as: VENTOLIN HFA Inhale 2 puffs into the lungs every 6 (six) hours as needed for wheezing or shortness of breath.   albuterol (2.5 MG/3ML) 0.083% nebulizer solution Commonly known as: PROVENTIL Take 3 mLs (2.5 mg total) by nebulization every 4 (four) hours as needed for wheezing or shortness of breath.   azithromycin 250 MG tablet Commonly known as: ZITHROMAX Take 1 tablet (250 mg total) by  mouth daily. Start taking on: July 13, 2023   budesonide-formoterol 80-4.5 MCG/ACT inhaler Commonly known as: Symbicort Inhale 2 puffs into the lungs daily. What changed: when to take this   cefUROXime 500 MG tablet Commonly known as: CEFTIN Take 1 tablet (500 mg total) by mouth 2 (two) times daily with a meal.            The results of significant diagnostics from this hospitalization (including imaging, microbiology, ancillary and laboratory) are listed below for reference.    CT Angio Chest PE W and/or Wo Contrast Result Date: 07/10/2023 CLINICAL DATA:  Left-sided chest pain. Concern for pulmonary embolism. EXAM: CT ANGIOGRAPHY CHEST WITH CONTRAST TECHNIQUE: Multidetector CT imaging of the chest was performed using the standard protocol during bolus administration of intravenous contrast. Multiplanar CT image reconstructions and MIPs were obtained to evaluate the vascular anatomy. RADIATION DOSE REDUCTION: This exam was performed according to the departmental dose-optimization program which includes automated exposure control, adjustment of the mA and/or kV according to patient size and/or use of iterative reconstruction technique. CONTRAST:  75mL OMNIPAQUE IOHEXOL 350 MG/ML SOLN COMPARISON:  Chest radiograph dated 07/10/2023. FINDINGS: Cardiovascular: There is no cardiomegaly or pericardial effusion. The thoracic aorta is unremarkable. The origins of the great vessels of the aortic arch appear patent. Evaluation of the pulmonary arteries is limited due to respiratory motion  and suboptimal visualization of the peripheral branches. No central pulmonary artery embolus identified. Mediastinum/Nodes: Mildly enlarged left hilar lymph nodes. The esophagus and the thyroid gland are grossly unremarkable. No mediastinal fluid collection. Lungs/Pleura: Small left pleural effusion. There is consolidative changes of the majority of the left lower lobe. Areas of streaky and ground-glass density in the  right middle lobe and lingula may represent atelectasis but concerning for infiltrate. No pneumothorax. The central airways are patent. Upper Abdomen: Cholecystectomy. Musculoskeletal: No acute osseous pathology. Review of the MIP images confirms the above findings. IMPRESSION: 1. No CT evidence of central pulmonary artery embolus. 2. Small left pleural effusion with consolidative changes of the majority of the left lower lobe, concerning for pneumonia. Follow-up to resolution recommended. 3. Areas of streaky and ground-glass density in the right middle lobe and lingula may represent atelectasis but concerning for infiltrate. Electronically Signed   By: Elgie Collard M.D.   On: 07/10/2023 17:24   DG Chest 2 View Result Date: 07/10/2023 CLINICAL DATA:  Chest pain EXAM: CHEST - 2 VIEW COMPARISON:  Chest radiograph 07/02/2023 FINDINGS: Stable cardiac and mediastinal contours. Persistent consolidation left mid lung. Interval increase in size of moderate left pleural effusion. No pneumothorax. Osseous structures unremarkable. Slight interval improvement in previously described right lower lung consolidative opacities. IMPRESSION: 1. Persistent consolidation left mid lung. 2. Interval increase in size of moderate left pleural effusion. 3. Slight interval improvement in previously described right lower lung consolidative opacities. 4. Continued radiographic follow-up is recommended to ensure resolution. Electronically Signed   By: Annia Belt M.D.   On: 07/10/2023 11:23   ECHOCARDIOGRAM COMPLETE Result Date: 07/03/2023    ECHOCARDIOGRAM REPORT   Patient Name:   Megan Barnes Date of Exam: 07/03/2023 Medical Rec #:  [id number]        Height:       68.0 in Accession #:    [id number]       Weight:       210.5 lb Date of Birth:  1995/10/19        BSA:          2.089 m Patient Age:    27 years         BP:           105/48 mmHg Patient Gender: F                HR:           101 bpm. Exam Location:  Inpatient Procedure:  2D Echo, Cardiac Doppler and Color Doppler (Both Spectral and Color            Flow Doppler were utilized during procedure). Indications:    Syncope  History:        Patient has no prior history of Echocardiogram examinations.  Sonographer:    Darlys Gales Referring Phys: [id number] VASUNDHRA RATHORE IMPRESSIONS  1. Left ventricular ejection fraction, by estimation, is 55 to 60%. The left ventricle has normal function. The left ventricle has no regional wall motion abnormalities. Left ventricular diastolic parameters were normal.  2. Right ventricular systolic function is normal. The right ventricular size is normal.  3. The mitral valve is normal in structure. Trivial mitral valve regurgitation. No evidence of mitral stenosis.  4. The aortic valve is normal in structure. Aortic valve regurgitation is not visualized. No aortic stenosis is present.  5. The inferior vena cava is normal in size with greater than 50% respiratory variability, suggesting right atrial pressure of  3 mmHg. FINDINGS  Left Ventricle: Left ventricular ejection fraction, by estimation, is 55 to 60%. The left ventricle has normal function. The left ventricle has no regional wall motion abnormalities. The left ventricular internal cavity size was normal in size. There is  no left ventricular hypertrophy. Left ventricular diastolic parameters were normal. Right Ventricle: The right ventricular size is normal. No increase in right ventricular wall thickness. Right ventricular systolic function is normal. Left Atrium: Left atrial size was normal in size. Right Atrium: Right atrial size was normal in size. Pericardium: There is no evidence of pericardial effusion. Mitral Valve: The mitral valve is normal in structure. Trivial mitral valve regurgitation. No evidence of mitral valve stenosis. Tricuspid Valve: The tricuspid valve is normal in structure. Tricuspid valve regurgitation is trivial. No evidence of tricuspid stenosis. Aortic Valve: The aortic  valve is normal in structure. Aortic valve regurgitation is not visualized. No aortic stenosis is present. Aortic valve mean gradient measures 4.0 mmHg. Aortic valve peak gradient measures 8.8 mmHg. Aortic valve area, by VTI measures 2.39 cm. Pulmonic Valve: The pulmonic valve was normal in structure. Pulmonic valve regurgitation is not visualized. No evidence of pulmonic stenosis. Aorta: The aortic root is normal in size and structure. Venous: The inferior vena cava is normal in size with greater than 50% respiratory variability, suggesting right atrial pressure of 3 mmHg. IAS/Shunts: No atrial level shunt detected by color flow Doppler.  LEFT VENTRICLE PLAX 2D LVIDd:         5.80 cm   Diastology LVIDs:         4.10 cm   LV e' medial:    7.51 cm/s LV PW:         0.70 cm   LV E/e' medial:  11.5 LV IVS:        0.90 cm   LV e' lateral:   7.62 cm/s LVOT diam:     1.80 cm   LV E/e' lateral: 11.4 LV SV:         55 LV SV Index:   27 LVOT Area:     2.54 cm  RIGHT VENTRICLE RV S prime:     15.40 cm/s TAPSE (M-mode): 3.0 cm LEFT ATRIUM             Index        RIGHT ATRIUM           Index LA Vol (A2C):   45.3 ml 21.69 ml/m  RA Area:     17.50 cm LA Vol (A4C):   38.3 ml 18.34 ml/m  RA Volume:   46.40 ml  22.21 ml/m LA Biplane Vol: 44.0 ml 21.06 ml/m  AORTIC VALVE AV Area (Vmax):    2.27 cm AV Area (Vmean):   2.47 cm AV Area (VTI):     2.39 cm AV Vmax:           148.00 cm/s AV Vmean:          99.100 cm/s AV VTI:            0.232 m AV Peak Grad:      8.8 mmHg AV Mean Grad:      4.0 mmHg LVOT Vmax:         132.00 cm/s LVOT Vmean:        96.100 cm/s LVOT VTI:          0.218 m LVOT/AV VTI ratio: 0.94  AORTA Ao Root diam: 2.50 cm MITRAL VALVE MV Area (PHT): 5.23 cm  SHUNTS MV Decel Time: 145 msec    Systemic VTI:  0.22 m MV E velocity: 86.50 cm/s  Systemic Diam: 1.80 cm MV A velocity: 75.40 cm/s MV E/A ratio:  1.15 Arvilla Meres MD Electronically signed by Arvilla Meres MD Signature Date/Time: 07/03/2023/1:29:17 PM     Final    CT CERVICAL SPINE WO CONTRAST Result Date: 07/02/2023 CLINICAL DATA:  Left-sided neck pain, initial encounter EXAM: CT CERVICAL SPINE WITHOUT CONTRAST TECHNIQUE: Multidetector CT imaging of the cervical spine was performed without intravenous contrast. Multiplanar CT image reconstructions were also generated. RADIATION DOSE REDUCTION: This exam was performed according to the departmental dose-optimization program which includes automated exposure control, adjustment of the mA and/or kV according to patient size and/or use of iterative reconstruction technique. COMPARISON:  02/15/2019 FINDINGS: Alignment: Mild loss of the normal cervical lordosis is noted. Skull base and vertebrae: 7 cervical segments are well visualized. Vertebral body height is well maintained. No acute fracture or acute facet abnormality is noted. The odontoid is within normal limits. Soft tissues and spinal canal: Surrounding soft tissue structures are within normal limits. Upper chest: Visualized lung apices are unremarkable. Other: None IMPRESSION: No acute abnormality noted. Electronically Signed   By: Alcide Clever M.D.   On: 07/02/2023 21:42   CT HEAD WO CONTRAST ( ) Result Date: 07/02/2023 CLINICAL DATA:  Initial evaluation for acute head trauma. EXAM: CT HEAD WITHOUT CONTRAST TECHNIQUE: Contiguous axial images were obtained from the base of the skull through the vertex without intravenous contrast. RADIATION DOSE REDUCTION: This exam was performed according to the departmental dose-optimization program which includes automated exposure control, adjustment of the mA and/or kV according to patient size and/or use of iterative reconstruction technique. COMPARISON:  None Available. FINDINGS: Brain: Cerebral volume within normal limits. No acute intracranial hemorrhage. No acute large vessel territory infarct. No mass lesion, midline shift or mass effect. No hydrocephalus or extra-axial fluid collection. Vascular: No abnormal  hyperdense vessel. Skull: Scalp soft tissues within normal limits.  Calvarium intact. Sinuses/Orbits: Globes orbital soft tissues within normal limits. Scattered mucosal thickening noted about the ethmoidal air cells. Small volume pneumatized secretions noted within the right sphenoid sinus. Mastoid air cells are clear. Other: None. IMPRESSION: 1. Normal head CT.  No acute intracranial abnormality. 2. Mild sphenoethmoidal sinus disease. Electronically Signed   By: Rise Mu M.D.   On: 07/02/2023 21:41   DG Chest 2 View Result Date: 07/02/2023 CLINICAL DATA:  Short of breath, hemoptysis EXAM: CHEST - 2 VIEW COMPARISON:  03/12/2022 FINDINGS: Frontal and lateral views of the chest demonstrate an unremarkable cardiac silhouette. Nodular areas of consolidation are seen in the right lower lobe, left upper lobe, left lower lobe. There is also dense consolidation within the left lower lobe retrocardiac region. Small left effusion. No pneumothorax. No acute bony abnormalities. IMPRESSION: 1. Multifocal areas of consolidation at the lung bases, most pronounced within the left lower lobe. Findings are consistent with bilateral pneumonia in the appropriate clinical setting, and follow-up PA and lateral chest X-ray is recommended in 3-4 weeks following trial of antibiotic therapy to ensure resolution. 2. Small left pleural effusion. Electronically Signed   By: Sharlet Salina M.D.   On: 07/02/2023 12:55   Labs:   Basic Metabolic Panel: Recent Labs  Lab 07/10/23 1035 07/11/23 0425 07/12/23 0449  NA 136 136 134*  K 3.8 3.9 3.8  CL 103 107 106  CO2 22 22 21*  GLUCOSE 127* 101* 98  BUN 12 16 14   CREATININE 0.73  0.77 0.67  CALCIUM 9.9 9.0 9.1     CBC: Recent Labs  Lab 07/10/23 1035 07/11/23 0425 07/12/23 0449  WBC 12.2* 10.5 11.5*  HGB 12.6 10.9* 10.7*  HCT 41.8 35.6* 34.0*  MCV 88.0 88.8 86.7  PLT 501* 451* 424*         SIGNED:   Calvert Cantor, MD  [facility] 07/12/2023,  2:32 PM
